# Patient Record
Sex: Female | Born: 1980 | Race: Black or African American | Hispanic: No | Marital: Married | Smoking: Former smoker
Health system: Southern US, Community
[De-identification: ages and names within clinical notes are randomized; demographics above are authoritative.]

## PROBLEM LIST (undated history)

## (undated) DIAGNOSIS — F329 Major depressive disorder, single episode, unspecified: Secondary | ICD-10-CM

## (undated) DIAGNOSIS — F32A Depression, unspecified: Secondary | ICD-10-CM

## (undated) DIAGNOSIS — F419 Anxiety disorder, unspecified: Secondary | ICD-10-CM

## (undated) DIAGNOSIS — F988 Other specified behavioral and emotional disorders with onset usually occurring in childhood and adolescence: Secondary | ICD-10-CM

## (undated) HISTORY — PX: WISDOM TOOTH EXTRACTION: SHX21

---

## 2014-06-19 ENCOUNTER — Ambulatory Visit: Payer: Self-pay | Admitting: Primary Care

## 2016-09-02 ENCOUNTER — Encounter: Payer: Self-pay | Admitting: Emergency Medicine

## 2016-09-02 ENCOUNTER — Emergency Department
Admission: EM | Admit: 2016-09-02 | Discharge: 2016-09-02 | Disposition: A | Discharge status: Home / Self Care | Payer: BLUE CROSS/BLUE SHIELD | Source: Home / Self Care | Attending: Family Medicine | Admitting: Family Medicine

## 2016-09-02 DIAGNOSIS — M7712 Lateral epicondylitis, left elbow: Secondary | ICD-10-CM | POA: Diagnosis not present

## 2016-09-02 MED ORDER — MELOXICAM 15 MG PO TABS: 15.0000 mg | ORAL_TABLET | Freq: Every day | ORAL | 1 refills | Status: DC

## 2016-09-02 NOTE — ED Triage Notes (Signed)
Left elbow pain x 4 days

## 2016-09-02 NOTE — Discharge Instructions (Signed)
Apply ice massage several times daily.  Continue until pain and swelling decrease.  Wear tennis elbow brace.  Begin range of motion and stretching exercises as tolerated.

## 2016-09-02 NOTE — ED Provider Notes (Signed)
[facility]    CSN: [id number] Arrival date & time: 09/02/16  [id number]     History   Chief Complaint Chief Complaint  Patient presents with  . Elbow Pain    HPI Juliet RudeMargo Bradsher is a 36 y.o. female.   Patient awoke with pain in her left lateral elbow 3 days ago.  She recalls no injury or change in activities, but admits that she tried some new weight lifting exercises about 1.5 weeks ago.  She has difficulty fully extending her elbow.  She has difficulty sleeping because of her pain.  She had no response to Baclofen.   The history is provided by the patient.  Arm Injury  Location:  Elbow Elbow location:  L elbow Injury: no   Pain details:    Quality:  Aching   Radiates to:  L shoulder   Severity:  Moderate   Onset quality:  Sudden   Duration:  3 days   Timing:  Constant   Progression:  Worsening Handedness:  Right-handed Prior injury to area:  No Relieved by:  Nothing Exacerbated by: extending elbow. Ineffective treatments:  Muscle relaxant Associated symptoms: decreased range of motion and muscle weakness   Associated symptoms: no fever, no numbness, no stiffness, no swelling and no tingling     History reviewed. No pertinent past medical history.  There are no active problems to display for this patient.   History reviewed. No pertinent surgical history.  OB History    No data available       Home Medications    Prior to Admission medications   Medication Sig Start Date End Date Taking? Authorizing Provider  baclofen (LIORESAL) 10 MG tablet Take 10 mg by mouth 3 (three) times daily.   Yes [provider]  buPROPion (WELLBUTRIN) 100 MG tablet Take 100 mg by mouth 2 (two) times daily.   Yes [provider]  etonogestrel (NEXPLANON) 68 MG IMPL implant 1 each by Subdermal route once.   Yes [provider]  ibuprofen (ADVIL,MOTRIN) 200 MG tablet Take 200 mg by mouth every 6 (six) hours as needed.   Yes [provider]  lisdexamfetamine (VYVANSE) 30 MG capsule Take 30 mg by mouth daily.   Yes [provider]  meloxicam (MOBIC) 15 MG tablet Take 1 tablet (15 mg total) by mouth daily. Take with food each evening. 09/02/16   Lattie HawBeese, Nyesha Cliff A, MD    Family History Family History  Problem Relation Age of Onset  . Hypertension Mother   . Diabetes Father     Social History Social History  Substance Use Topics  . Smoking status: Current Every Day Smoker  . Smokeless tobacco: Never Used  . Alcohol use Not on file     Allergies   Patient has no known allergies.   Review of Systems Review of Systems  Constitutional: Negative for fever.  Musculoskeletal: Negative for stiffness.  All other systems reviewed and are negative.    Physical Exam Triage Vital Signs ED Triage Vitals  Enc Vitals Group     BP 09/02/16 0946 122/78     Pulse Rate 09/02/16 0946 68     Resp --      Temp 09/02/16 0946 97.8 F (36.6 C)     Temp Source 09/02/16 0946 Oral     SpO2 09/02/16 0946 99 %     Weight 09/02/16 0946 147 lb (66.7 kg)     Height 09/02/16 0946 5\' 6"  (1.676 m)  Head Circumference --      Peak Flow --      Pain Score 09/02/16 0947 7     Pain Loc --      Pain Edu? --      Excl. in GC? --    No data found.   Updated Vital Signs BP 122/78 (BP Location: Left Arm)   Pulse 68   Temp 97.8 F (36.6 C) (Oral)   Ht 5\' 6"  (1.676 m)   Wt 147 lb (66.7 kg)   SpO2 99%   BMI 23.73 kg/m   Visual Acuity Right Eye Distance:   Left Eye Distance:   Bilateral Distance:    Right Eye Near:   Left Eye Near:    Bilateral Near:     Physical Exam  Constitutional: She appears well-developed and well-nourished. No distress.  HENT:  Head: Normocephalic.  Eyes: Pupils are equal, round, and reactive to light.  Neck: Normal range of motion.  Cardiovascular: Normal rate.   Pulmonary/Chest: Effort normal.  Musculoskeletal:       Left elbow: She exhibits normal range of motion, no swelling, no  effusion, no deformity and no laceration. Tenderness found. Lateral epicondyle tenderness noted.       Arms: There is distinct tenderness over the lateral epicondyle of the left elbow.  Palpation there during resisted dorsiflexion and supination of the wrist recreates her pain.   Neurological: She is alert.  Skin: Skin is warm and dry. No rash noted.  Nursing note and vitals reviewed.    UC Treatments / Results  Labs (all labs ordered are listed, but only abnormal results are displayed) Labs Reviewed - No data to display  EKG  EKG Interpretation None       Radiology No results found.  Procedures Procedures (including critical care time)  Medications Ordered in UC Medications - No data to display   Initial Impression / Assessment and Plan / UC Course  I have reviewed the triage vital signs and the nursing notes.  Pertinent labs & imaging results that were available during my care of the patient were reviewed by me and considered in my medical decision making (see chart for details).    Dispensed tennis elbow brace.  Rx for Mobic 15mg . Apply ice massage several times daily.  Continue until pain and swelling decrease.  Wear tennis elbow brace.  Begin range of motion and stretching exercises as tolerated. Followup with Dr. Rodney Langtonhomas Thekkekandam or Dr. Clementeen GrahamEvan Corey ([facility]) if not improving about two weeks.     Final Clinical Impressions(s) / UC Diagnoses   Final diagnoses:  Lateral epicondylitis, left elbow    New Prescriptions New Prescriptions   MELOXICAM (MOBIC) 15 MG TABLET    Take 1 tablet (15 mg total) by mouth daily. Take with food each evening.     Lattie HawBeese, Abygail Galeno A, MD 09/02/16 1022

## 2017-03-28 ENCOUNTER — Encounter: Payer: Self-pay | Admitting: Emergency Medicine

## 2017-03-28 ENCOUNTER — Emergency Department (INDEPENDENT_AMBULATORY_CARE_PROVIDER_SITE_OTHER)
Admission: EM | Admit: 2017-03-28 | Discharge: 2017-03-28 | Disposition: A | Discharge status: Home / Self Care | Payer: BLUE CROSS/BLUE SHIELD | Source: Home / Self Care | Attending: Family Medicine | Admitting: Family Medicine

## 2017-03-28 ENCOUNTER — Emergency Department (INDEPENDENT_AMBULATORY_CARE_PROVIDER_SITE_OTHER): Payer: BLUE CROSS/BLUE SHIELD

## 2017-03-28 ENCOUNTER — Other Ambulatory Visit: Payer: Self-pay

## 2017-03-28 DIAGNOSIS — M940 Chondrocostal junction syndrome [Tietze]: Secondary | ICD-10-CM | POA: Diagnosis not present

## 2017-03-28 DIAGNOSIS — R0781 Pleurodynia: Secondary | ICD-10-CM

## 2017-03-28 HISTORY — DX: Anxiety disorder, unspecified: F41.9

## 2017-03-28 HISTORY — DX: Depression, unspecified: F32.A

## 2017-03-28 HISTORY — DX: Other specified behavioral and emotional disorders with onset usually occurring in childhood and adolescence: F98.8

## 2017-03-28 HISTORY — DX: Major depressive disorder, single episode, unspecified: F32.9

## 2017-03-28 LAB — POCT URINALYSIS DIP (MANUAL ENTRY)
Bilirubin, UA: NEGATIVE
Glucose, UA: NEGATIVE mg/dL
Ketones, POC UA: NEGATIVE mg/dL
Leukocytes, UA: NEGATIVE
NITRITE UA: NEGATIVE
PROTEIN UA: NEGATIVE mg/dL
RBC UA: NEGATIVE
Spec Grav, UA: >=1.03 — AB (ref 1.010–1.025)
UROBILINOGEN UA: 0.2 U/dL
pH, UA: 5.5 (ref 5.0–8.0)

## 2017-03-28 MED ORDER — IBUPROFEN 600 MG PO TABS
600.0000 mg | ORAL_TABLET | Freq: Once | ORAL | Status: AC
  Administered 2017-03-28: 600 mg via ORAL

## 2017-03-28 MED ORDER — PREDNISONE 20 MG PO TABS: ORAL_TABLET | ORAL | 0 refills | Status: DC

## 2017-03-28 NOTE — Discharge Instructions (Signed)
Apply ice pack for 20 to 30 minutes, 3 to 4 times daily  Continue until pain and swelling decrease.  May take Tylenol as needed for pain. 

## 2017-03-28 NOTE — ED Provider Notes (Signed)
[facility]    CSN: [id number] Arrival date & time: 03/28/17  1202     History   Chief Complaint Chief Complaint  Patient presents with  . Dysuria    HPI Courtney RudeMargo Washington is a 37 y.o. female.   Patient reports that she developed dysuria about 6 days ago.  She visited an urgent care center 4 days ago where she was prescribed Cipro 500mg  BID for five days.  During the past two days she has had nausea without vomiting, chills, and pain with bowel movements.  She complains of persistent left flank and chest pain.  She notes that ibuprofen 400mg  improves her symptoms.  She uses Implanon for contraception. She states that she has not received a culture and sensitivity report from her recent urgent care visit.   The history is provided by the patient.  Flank Pain  This is a new problem. The current episode started more than 2 days ago. The problem occurs constantly. The problem has been gradually worsening. Pertinent negatives include no abdominal pain and no shortness of breath. The symptoms are aggravated by twisting. The symptoms are relieved by NSAIDs.    Past Medical History:  Diagnosis Date  . ADD (attention deficit disorder)   . Anxiety and depression     There are no active problems to display for this patient.   History reviewed. No pertinent surgical history.  OB History    No data available       Home Medications    Prior to Admission medications   Medication Sig Start Date End Date Taking? Authorizing Provider  baclofen (LIORESAL) 10 MG tablet Take 10 mg by mouth 3 (three) times daily.    [provider]  buPROPion (WELLBUTRIN) 100 MG tablet Take 100 mg by mouth 2 (two) times daily.    [provider]  etonogestrel (NEXPLANON) 68 MG IMPL implant 1 each by Subdermal route once.    [provider]  ibuprofen (ADVIL,MOTRIN) 200 MG tablet Take 200 mg by mouth every 6 (six) hours as needed.    [provider]    lisdexamfetamine (VYVANSE) 30 MG capsule Take 30 mg by mouth daily.    [provider]  meloxicam (MOBIC) 15 MG tablet Take 1 tablet (15 mg total) by mouth daily. Take with food each evening. 09/02/16   Lattie HawBeese, Mera Gunkel A, MD  predniSONE (DELTASONE) 20 MG tablet Take one tab by mouth twice daily for 4 days, then one daily for 3 days. Take with food. 03/28/17   Lattie HawBeese, Naida Escalante A, MD    Family History Family History  Problem Relation Age of Onset  . Hypertension Mother   . Diabetes Father     Social History Social History   Tobacco Use  . Smoking status: Current Every Day Smoker  . Smokeless tobacco: Never Used  Substance Use Topics  . Alcohol use: Yes  . Drug use: Not on file     Allergies   Patient has no known allergies.   Review of Systems Review of Systems  Constitutional: Positive for chills. Negative for diaphoresis, fatigue and fever.  HENT: Negative.   Eyes: Negative.   Respiratory: Negative for cough, chest tightness and shortness of breath.   Cardiovascular: Negative.   Gastrointestinal: Positive for nausea. Negative for abdominal pain and vomiting.  Genitourinary: Positive for flank pain.  Skin: Negative.   Neurological: Negative.      Physical Exam Triage Vital Signs ED Triage Vitals  Enc Vitals Group  BP 03/28/17 1423 97/62     Pulse Rate 03/28/17 1423 63     Resp 03/28/17 1423 16     Temp 03/28/17 1423 98 F (36.7 C)     Temp Source 03/28/17 1423 Oral     SpO2 03/28/17 1423 100 %     Weight 03/28/17 1424 145 lb (65.8 kg)     Height 03/28/17 1424 5\' 6"  (1.676 m)     Head Circumference --      Peak Flow --      Pain Score 03/28/17 1423 7     Pain Loc --      Pain Edu? --      Excl. in GC? --    No data found.  Updated Vital Signs BP 97/62 (BP Location: Right Arm)   Pulse 63   Temp 98 F (36.7 C) (Oral)   Resp 16   Ht 5\' 6"  (1.676 m)   Wt 145 lb (65.8 kg)   SpO2 100%   BMI 23.40 kg/m   Visual Acuity Right Eye Distance:    Left Eye Distance:   Bilateral Distance:    Right Eye Near:   Left Eye Near:    Bilateral Near:     Physical Exam  Constitutional: She appears well-developed and well-nourished. No distress.  HENT:  Head: Normocephalic.  Right Ear: External ear normal.  Left Ear: External ear normal.  Nose: Nose normal.  Mouth/Throat: Oropharynx is clear and moist.  Eyes: Conjunctivae are normal. Pupils are equal, round, and reactive to light.  Neck: Neck supple.  Cardiovascular: Normal heart sounds.  Pulmonary/Chest: Breath sounds normal.     She exhibits tenderness.  There is tenderness to palpation over left anterior/lateral/posterior inferior ribs as noted on diagram.   No flank tenderness present.    Abdominal: Soft. There is no tenderness.  Musculoskeletal: She exhibits no edema.  Lymphadenopathy:    She has no cervical adenopathy.  Skin: Skin is warm and dry. No rash noted.  Nursing note and vitals reviewed.    UC Treatments / Results  Labs (all labs ordered are listed, but only abnormal results are displayed) Labs Reviewed  POCT URINALYSIS DIP (MANUAL ENTRY) - Abnormal; Notable for the following components:      Result Value   Spec Grav, UA >=1.030 (*)    All other components within normal limits  URINE CULTURE    EKG  EKG Interpretation None       Radiology Dg Ribs Unilateral W/chest Left  Result Date: 03/28/2017 CLINICAL DATA:  Left lateral rib pain 2 weeks.  No injury. EXAM: LEFT RIBS AND CHEST - 3+ VIEW COMPARISON:  None. FINDINGS: Lungs are clear. Cardiomediastinal silhouette is within normal. No evidence of rib fracture. IMPRESSION: Negative. Electronically Signed   By: Elberta Fortis M.D.   On: 03/28/2017 15:19    Procedures Procedures (including critical care time)  Medications Ordered in UC Medications  ibuprofen (ADVIL,MOTRIN) tablet 600 mg (600 mg Oral Given 03/28/17 1428)     Initial Impression / Assessment and Plan / UC Course  I have reviewed  the triage vital signs and the nursing notes.  Pertinent labs & imaging results that were available during my care of the patient were reviewed by me and considered in my medical decision making (see chart for details).    Note normal chest X-ray. Begin prednisone burst/taper. Apply ice pack for 20 to 30 minutes, 3 to 4 times daily  Continue until pain and swelling decrease.  May take Tylenol as needed for pain. Followup with Dr. Rodney Langton or Dr. Clementeen Graham ([facility]) if not improving about th weeks.     Final Clinical Impressions(s) / UC Diagnoses   Final diagnoses:  Costochondritis    ED Discharge Orders        Ordered    predniSONE (DELTASONE) 20 MG tablet     03/28/17 1617         Lattie Haw, MD 04/02/17 [phone]

## 2017-03-28 NOTE — ED Triage Notes (Signed)
Patient went to walk-in urgent care 4 days ago for dysuria and was given Cipro for UTI; after 3 days of rx her symptoms have worsened and she has not received culture and sensitivity report. No OTCs today.

## 2017-03-29 LAB — URINE CULTURE
MICRO NUMBER:: 90211423
RESULT: NO GROWTH
SPECIMEN QUALITY:: ADEQUATE

## 2017-03-30 ENCOUNTER — Telehealth: Payer: Self-pay

## 2017-03-30 NOTE — Telephone Encounter (Signed)
Feeling better now.  UCX results given

## 2017-10-03 ENCOUNTER — Encounter: Payer: Self-pay | Admitting: Emergency Medicine

## 2017-10-03 ENCOUNTER — Emergency Department
Admission: EM | Admit: 2017-10-03 | Discharge: 2017-10-03 | Disposition: A | Discharge status: Home / Self Care | Payer: 59 | Attending: Emergency Medicine | Admitting: Emergency Medicine

## 2017-10-03 ENCOUNTER — Emergency Department: Payer: 59

## 2017-10-03 DIAGNOSIS — S62234A Other nondisplaced fracture of base of first metacarpal bone, right hand, initial encounter for closed fracture: Secondary | ICD-10-CM | POA: Diagnosis not present

## 2017-10-03 DIAGNOSIS — Y9389 Activity, other specified: Secondary | ICD-10-CM | POA: Insufficient documentation

## 2017-10-03 DIAGNOSIS — F17210 Nicotine dependence, cigarettes, uncomplicated: Secondary | ICD-10-CM | POA: Insufficient documentation

## 2017-10-03 DIAGNOSIS — Y92009 Unspecified place in unspecified non-institutional (private) residence as the place of occurrence of the external cause: Secondary | ICD-10-CM | POA: Diagnosis not present

## 2017-10-03 DIAGNOSIS — Y999 Unspecified external cause status: Secondary | ICD-10-CM | POA: Diagnosis not present

## 2017-10-03 DIAGNOSIS — Z79899 Other long term (current) drug therapy: Secondary | ICD-10-CM | POA: Diagnosis not present

## 2017-10-03 DIAGNOSIS — S6991XA Unspecified injury of right wrist, hand and finger(s), initial encounter: Secondary | ICD-10-CM | POA: Diagnosis present

## 2017-10-03 DIAGNOSIS — X500XXA Overexertion from strenuous movement or load, initial encounter: Secondary | ICD-10-CM | POA: Insufficient documentation

## 2017-10-03 NOTE — ED Provider Notes (Signed)
[name] Emergency Department Provider Note   ____________________________________________   First MD Initiated Contact with Patient 10/03/17 0124     (approximate)  I have reviewed the triage vital signs and the nursing notes.   HISTORY  Chief Complaint Hand Pain    HPI Courtney Washington is a 37 y.o. female who comes into the hospital today with some thumb pain after trying to do a handstand at home.  The patient states that she heard a loud crunch and was concerned that she broke her thumb.  She states that it hurts to move her right thumb but the pain is about a 5 out of 10 in intensity.  The patient did ice the area at home but did not take any medicine for pain.  She is here in the hospital today for further evaluation of her symptoms.  She has no numbness or tingling.  There is also no swelling to her thumb.   Past Medical History:  Diagnosis Date  . ADD (attention deficit disorder)   . Anxiety and depression     There are no active problems to display for this patient.   History reviewed. No pertinent surgical history.  Prior to Admission medications   Medication Sig Start Date End Date Taking? Authorizing Provider  baclofen (LIORESAL) 10 MG tablet Take 10 mg by mouth 3 (three) times daily.    [provider]  buPROPion (WELLBUTRIN) 100 MG tablet Take 100 mg by mouth 2 (two) times daily.    [provider]  etonogestrel (NEXPLANON) 68 MG IMPL implant 1 each by Subdermal route once.    [provider]  ibuprofen (ADVIL,MOTRIN) 200 MG tablet Take 200 mg by mouth every 6 (six) hours as needed.    [provider]  lisdexamfetamine (VYVANSE) 30 MG capsule Take 30 mg by mouth daily.    [provider]  meloxicam (MOBIC) 15 MG tablet Take 1 tablet (15 mg total) by mouth daily. Take with food each evening. 09/02/16   Lattie HawBeese, Stephen A, MD  predniSONE (DELTASONE) 20 MG tablet Take one tab by mouth twice daily  for 4 days, then one daily for 3 days. Take with food. 03/28/17   Lattie HawBeese, Stephen A, MD    Allergies Patient has no known allergies.  Family History  Problem Relation Age of Onset  . Hypertension Mother   . Diabetes Father     Social History Social History   Tobacco Use  . Smoking status: Current Every Day Smoker  . Smokeless tobacco: Never Used  Substance Use Topics  . Alcohol use: Yes  . Drug use: Not on file    Review of Systems  Constitutional: No fever/chills Eyes: No visual changes. ENT: No sore throat. Cardiovascular: Denies chest pain. Respiratory: Denies shortness of breath. Gastrointestinal: No abdominal pain.   Genitourinary: Negative for dysuria. Musculoskeletal: Right thumb pain Skin: Negative for rash. Neurological: Negative for headaches   ____________________________________________   PHYSICAL EXAM:  VITAL SIGNS: ED Triage Vitals  Enc Vitals Group     BP 10/03/17 0022 (!) 94/55     Pulse Rate 10/03/17 0022 75     Resp 10/03/17 0022 18     Temp 10/03/17 0022 98.5 F (36.9 C)     Temp Source 10/03/17 0022 Oral     SpO2 10/03/17 0022 99 %     Weight 10/03/17 0018 145 lb (65.8 kg)     Height 10/03/17 0018 5\' 6"  (1.676 m)  Head Circumference --      Peak Flow --      Pain Score 10/03/17 0018 6     Pain Loc --      Pain Edu? --      Excl. in GC? --     Constitutional: Alert and oriented. Well appearing and in mild distress. Eyes: Conjunctivae are normal. PERRL. EOMI. Head: Atraumatic. Nose: No congestion/rhinnorhea. Mouth/Throat: Mucous membranes are moist.  Oropharynx non-erythematous. Cardiovascular: Normal rate, regular rhythm.  Respiratory: Normal respiratory effort.  No retractions.  Gastrointestinal: Soft and nontender. No distention.  Positive bowel sounds Musculoskeletal: Tenderness to palpation at the base of the thumb with no significant swelling or bruising. Neurologic:  Normal speech and language.  Skin:  Skin is warm, dry  and intact.  Psychiatric: Mood and affect are normal.   ____________________________________________   LABS (all labs ordered are listed, but only abnormal results are displayed)  Labs Reviewed - No data to display ____________________________________________  EKG  none ____________________________________________  RADIOLOGY  ED MD interpretation: Right thumb x-ray: Possible small intra-articular fracture base of the first proximal phalanx  Official radiology report(s): Dg Finger Thumb Right  Result Date: 10/03/2017 CLINICAL DATA:  Injury EXAM: RIGHT THUMB 2+V COMPARISON:  None. FINDINGS: Possible small intra-articular fracture base of the first proximal phalanx. No subluxation. No radiopaque foreign body IMPRESSION: Possible small intra-articular fracture base of the first proximal phalanx Electronically Signed   By: Jasmine Pang M.D.   On: 10/03/2017 00:57    ____________________________________________   PROCEDURES  Procedure(s) performed: None  Procedures  Critical Care performed: No  ____________________________________________   INITIAL IMPRESSION / ASSESSMENT AND PLAN / ED COURSE  As part of my medical decision making, I reviewed the following data within the electronic MEDICAL RECORD NUMBER Notes from prior ED visits and [place] Controlled Substance Database   The patient did receive an x-ray for her injury and it appears that she may have a small fracture at the base of her first proximal phalanx.  The patient is not in any significant pain so I did order a prefabricated thumb spica for the patient.  The splint was placed on the patient and she will be discharged home to follow-up with orthopedic surgery.      ____________________________________________   FINAL CLINICAL IMPRESSION(S) / ED DIAGNOSES  Final diagnoses:  Other closed nondisplaced fracture of base of first metacarpal bone of right hand, initial encounter     ED Discharge Orders    None        Note:  This document was prepared using Dragon voice recognition software and may include unintentional dictation errors.    Rebecka Apley, MD 10/03/17 [phone]

## 2017-10-03 NOTE — ED Triage Notes (Signed)
Patient states that she was doing a hand stand and injured her right thumb.

## 2017-10-03 NOTE — Discharge Instructions (Addendum)
Please follow up with orthopedic surgery. °

## 2017-12-31 ENCOUNTER — Other Ambulatory Visit: Payer: Self-pay | Admitting: Sports Medicine

## 2017-12-31 DIAGNOSIS — S63641D Sprain of metacarpophalangeal joint of right thumb, subsequent encounter: Secondary | ICD-10-CM

## 2017-12-31 DIAGNOSIS — M79644 Pain in right finger(s): Secondary | ICD-10-CM

## 2017-12-31 DIAGNOSIS — S62514D Nondisplaced fracture of proximal phalanx of right thumb, subsequent encounter for fracture with routine healing: Secondary | ICD-10-CM

## 2018-01-20 ENCOUNTER — Ambulatory Visit
Admission: RE | Admit: 2018-01-20 | Discharge: 2018-01-20 | Disposition: A | Discharge status: Home / Self Care | Payer: 59 | Source: Ambulatory Visit | Attending: Sports Medicine | Admitting: Sports Medicine

## 2018-01-20 DIAGNOSIS — S5331XA Traumatic rupture of right ulnar collateral ligament, initial encounter: Secondary | ICD-10-CM | POA: Diagnosis not present

## 2018-01-20 DIAGNOSIS — S62514D Nondisplaced fracture of proximal phalanx of right thumb, subsequent encounter for fracture with routine healing: Secondary | ICD-10-CM | POA: Insufficient documentation

## 2018-01-20 DIAGNOSIS — X58XXXA Exposure to other specified factors, initial encounter: Secondary | ICD-10-CM | POA: Insufficient documentation

## 2018-01-20 DIAGNOSIS — M79644 Pain in right finger(s): Secondary | ICD-10-CM | POA: Insufficient documentation

## 2018-01-20 DIAGNOSIS — S63641D Sprain of metacarpophalangeal joint of right thumb, subsequent encounter: Secondary | ICD-10-CM

## 2018-01-20 DIAGNOSIS — S60221A Contusion of right hand, initial encounter: Secondary | ICD-10-CM | POA: Insufficient documentation

## 2018-02-23 ENCOUNTER — Encounter: Payer: Self-pay | Admitting: *Deleted

## 2018-02-23 ENCOUNTER — Other Ambulatory Visit: Payer: Self-pay

## 2018-02-23 NOTE — Anesthesia Preprocedure Evaluation (Addendum)
Anesthesia Evaluation  Patient identified by MRN, date of birth, ID band Patient awake    Reviewed: Allergy & Precautions, NPO status , Patient's Chart, lab work & pertinent test results  History of Anesthesia Complications Negative for: history of anesthetic complications  Airway Mallampati: I   Neck ROM: Full    Dental no notable dental hx.    Pulmonary former smoker (quit 2018),    Pulmonary exam normal breath sounds clear to auscultation       Cardiovascular Exercise Tolerance: Good negative cardio ROS Normal cardiovascular exam Rhythm:Regular Rate:Normal     Neuro/Psych PSYCHIATRIC DISORDERS (ADD) Anxiety Depression negative neurological ROS     GI/Hepatic negative GI ROS,   Endo/Other  negative endocrine ROS  Renal/GU negative Renal ROS     Musculoskeletal   Abdominal   Peds  Hematology negative hematology ROS (+)   Anesthesia Other Findings   Reproductive/Obstetrics                            Anesthesia Physical Anesthesia Plan  ASA: II  Anesthesia Plan: General   Post-op Pain Management:    Induction: Intravenous  PONV Risk Score and Plan: 3 and Dexamethasone and Ondansetron  Airway Management Planned: LMA  Additional Equipment:   Intra-op Plan:   Post-operative Plan: Extubation in OR  Informed Consent: I have reviewed the patients History and Physical, chart, labs and discussed the procedure including the risks, benefits and alternatives for the proposed anesthesia with the patient or authorized representative who has indicated his/her understanding and acceptance.       Plan Discussed with: CRNA  Anesthesia Plan Comments:        Anesthesia Quick Evaluation

## 2018-03-02 ENCOUNTER — Ambulatory Visit
Admission: RE | Admit: 2018-03-02 | Discharge: 2018-03-02 | Disposition: A | Discharge status: Home / Self Care | Payer: 59 | Attending: Surgery | Admitting: Surgery

## 2018-03-02 ENCOUNTER — Ambulatory Visit: Payer: 59 | Admitting: Anesthesiology

## 2018-03-02 ENCOUNTER — Encounter
Admission: RE | Disposition: A | Discharge status: Home / Self Care | Payer: Self-pay | Source: Home / Self Care | Attending: Surgery

## 2018-03-02 DIAGNOSIS — Y9343 Activity, gymnastics: Secondary | ICD-10-CM | POA: Diagnosis not present

## 2018-03-02 DIAGNOSIS — S63641A Sprain of metacarpophalangeal joint of right thumb, initial encounter: Secondary | ICD-10-CM | POA: Diagnosis present

## 2018-03-02 DIAGNOSIS — F988 Other specified behavioral and emotional disorders with onset usually occurring in childhood and adolescence: Secondary | ICD-10-CM | POA: Diagnosis not present

## 2018-03-02 DIAGNOSIS — F329 Major depressive disorder, single episode, unspecified: Secondary | ICD-10-CM | POA: Diagnosis not present

## 2018-03-02 DIAGNOSIS — Z79899 Other long term (current) drug therapy: Secondary | ICD-10-CM | POA: Diagnosis not present

## 2018-03-02 HISTORY — PX: CARPOMETACARPAL (CMC) FUSION OF THUMB: SHX6290

## 2018-03-02 SURGERY — CARPOMETACARPAL (CMC) FUSION OF THUMB
Anesthesia: General | Laterality: Right

## 2018-03-02 MED ORDER — METOCLOPRAMIDE HCL 5 MG PO TABS: 5.0000 mg | ORAL_TABLET | Freq: Three times a day (TID) | ORAL | Status: DC | PRN

## 2018-03-02 MED ORDER — CEFAZOLIN SODIUM-DEXTROSE 2-4 GM/100ML-% IV SOLN
2.0000 g | Freq: Once | INTRAVENOUS | Status: AC
  Administered 2018-03-02: 2 g via INTRAVENOUS

## 2018-03-02 MED ORDER — METOCLOPRAMIDE HCL 5 MG/ML IJ SOLN: 5.0000 mg | Freq: Three times a day (TID) | INTRAMUSCULAR | Status: DC | PRN

## 2018-03-02 MED ORDER — BUPIVACAINE HCL (PF) 0.5 % IJ SOLN
INTRAMUSCULAR | Status: DC | PRN
  Administered 2018-03-02: 5 mL

## 2018-03-02 MED ORDER — HYDROCODONE-ACETAMINOPHEN 5-325 MG PO TABS: 1.0000 | ORAL_TABLET | ORAL | Status: DC | PRN

## 2018-03-02 MED ORDER — OXYCODONE HCL 5 MG PO TABS
5.0000 mg | ORAL_TABLET | Freq: Once | ORAL | Status: AC | PRN
  Administered 2018-03-02: 5 mg via ORAL

## 2018-03-02 MED ORDER — FENTANYL CITRATE (PF) 100 MCG/2ML IJ SOLN
INTRAMUSCULAR | Status: DC | PRN
  Administered 2018-03-02 (×2): 25 ug via INTRAVENOUS
  Administered 2018-03-02: 50 ug via INTRAVENOUS

## 2018-03-02 MED ORDER — ONDANSETRON HCL 4 MG/2ML IJ SOLN
INTRAMUSCULAR | Status: DC | PRN
  Administered 2018-03-02: 4 mg via INTRAVENOUS

## 2018-03-02 MED ORDER — ACETAMINOPHEN 10 MG/ML IV SOLN
1000.0000 mg | Freq: Once | INTRAVENOUS | Status: DC | PRN
  Administered 2018-03-02: 1000 mg via INTRAVENOUS

## 2018-03-02 MED ORDER — PROPOFOL 10 MG/ML IV BOLUS
INTRAVENOUS | Status: DC | PRN
  Administered 2018-03-02: 200 mg via INTRAVENOUS

## 2018-03-02 MED ORDER — LACTATED RINGERS IV SOLN
INTRAVENOUS | Status: DC
  Administered 2018-03-02: 11:00:00 via INTRAVENOUS

## 2018-03-02 MED ORDER — LIDOCAINE HCL (CARDIAC) PF 100 MG/5ML IV SOSY
PREFILLED_SYRINGE | INTRAVENOUS | Status: DC | PRN
  Administered 2018-03-02: 50 mg via INTRATRACHEAL

## 2018-03-02 MED ORDER — KETAMINE HCL 50 MG/ML IJ SOLN
INTRAMUSCULAR | Status: DC | PRN
  Administered 2018-03-02: 25 mg via INTRAMUSCULAR

## 2018-03-02 MED ORDER — GLYCOPYRROLATE 0.2 MG/ML IJ SOLN
INTRAMUSCULAR | Status: DC | PRN
  Administered 2018-03-02: 0.2 mg via INTRAVENOUS

## 2018-03-02 MED ORDER — HYDROCODONE-ACETAMINOPHEN 5-325 MG PO TABS: 1.0000 | ORAL_TABLET | Freq: Four times a day (QID) | ORAL | 0 refills | Status: DC | PRN

## 2018-03-02 MED ORDER — OXYCODONE HCL 5 MG/5ML PO SOLN: 5.0000 mg | Freq: Once | ORAL | Status: AC | PRN

## 2018-03-02 MED ORDER — MIDAZOLAM HCL 5 MG/5ML IJ SOLN
INTRAMUSCULAR | Status: DC | PRN
  Administered 2018-03-02: 2 mg via INTRAVENOUS

## 2018-03-02 MED ORDER — ONDANSETRON HCL 4 MG/2ML IJ SOLN
4.0000 mg | Freq: Once | INTRAMUSCULAR | Status: AC | PRN
  Administered 2018-03-02: 4 mg via INTRAVENOUS

## 2018-03-02 MED ORDER — ONDANSETRON HCL 4 MG/2ML IJ SOLN: 4.0000 mg | Freq: Four times a day (QID) | INTRAMUSCULAR | Status: DC | PRN

## 2018-03-02 MED ORDER — POTASSIUM CHLORIDE IN NACL 20-0.9 MEQ/L-% IV SOLN: INTRAVENOUS | Status: DC

## 2018-03-02 MED ORDER — LACTATED RINGERS IV SOLN: INTRAVENOUS | Status: DC

## 2018-03-02 MED ORDER — ONDANSETRON HCL 4 MG PO TABS: 4.0000 mg | ORAL_TABLET | Freq: Four times a day (QID) | ORAL | Status: DC | PRN

## 2018-03-02 MED ORDER — DEXAMETHASONE SODIUM PHOSPHATE 4 MG/ML IJ SOLN
INTRAMUSCULAR | Status: DC | PRN
  Administered 2018-03-02: 6 mg via INTRAVENOUS

## 2018-03-02 MED ORDER — FENTANYL CITRATE (PF) 100 MCG/2ML IJ SOLN
25.0000 ug | INTRAMUSCULAR | Status: DC | PRN
  Administered 2018-03-02 (×2): 25 ug via INTRAVENOUS

## 2018-03-02 SURGICAL SUPPLY — 41 items
ANCHOR REPAIR HAND WRIST (Orthopedic Implant) ×3 IMPLANT
BANDAGE ELASTIC 3 LF NS (GAUZE/BANDAGES/DRESSINGS) ×3 IMPLANT
BLADE MINI RND TIP GREEN BEAV (BLADE) IMPLANT
BLADE OSC/SAGITTAL 5.5X25 (BLADE) ×3 IMPLANT
BNDG COHESIVE 4X5 TAN STRL (GAUZE/BANDAGES/DRESSINGS) ×3 IMPLANT
BNDG ESMARK 4X12 TAN STRL LF (GAUZE/BANDAGES/DRESSINGS) ×3 IMPLANT
BNDG STRETCH 4X75 STRL LF (GAUZE/BANDAGES/DRESSINGS) ×3 IMPLANT
CAST PADDING 3X4FT ST 30246 (SOFTGOODS) ×4
CHLORAPREP W/TINT 26ML (MISCELLANEOUS) ×3 IMPLANT
CLOSURE WOUND 1/2 X4 (GAUZE/BANDAGES/DRESSINGS) ×1
CORD BIP STRL DISP 12FT (MISCELLANEOUS) ×3 IMPLANT
COVER LIGHT HANDLE UNIVERSAL (MISCELLANEOUS) ×6 IMPLANT
CUFF TOURN SGL QUICK 18 (TOURNIQUET CUFF) ×3 IMPLANT
DRAPE FLUOR MINI C-ARM 54X84 (DRAPES) ×3 IMPLANT
GAUZE PETRO XEROFOAM 1X8 (MISCELLANEOUS) ×3 IMPLANT
GLOVE BIO SURGEON STRL SZ8 (GLOVE) ×6 IMPLANT
GLOVE INDICATOR 8.0 STRL GRN (GLOVE) ×3 IMPLANT
GOWN STRL REUS W/ TWL LRG LVL3 (GOWN DISPOSABLE) ×1 IMPLANT
GOWN STRL REUS W/ TWL XL LVL3 (GOWN DISPOSABLE) ×1 IMPLANT
GOWN STRL REUS W/TWL LRG LVL3 (GOWN DISPOSABLE) ×2
GOWN STRL REUS W/TWL XL LVL3 (GOWN DISPOSABLE) ×2
K-WIRE DBL END TROCAR 6X.062 (WIRE) ×6
KIT TURNOVER KIT A (KITS) ×3 IMPLANT
KWIRE DBL END TROCAR 6X.062 (WIRE) ×2 IMPLANT
NS IRRIG 500ML POUR BTL (IV SOLUTION) ×3 IMPLANT
PACK EXTREMITY ARMC (MISCELLANEOUS) ×3 IMPLANT
PAD CAST CTTN 3X4 STRL (SOFTGOODS) ×2 IMPLANT
PADDING CAST 2X4YD ST (MISCELLANEOUS) ×2
PADDING CAST 3IN STRL (MISCELLANEOUS) ×2
PADDING CAST BLEND 2X4 STRL (MISCELLANEOUS) ×1 IMPLANT
PADDING CAST BLEND 3X4 STRL (MISCELLANEOUS) ×1 IMPLANT
SPLINT WRIST M LT TX990308 (SOFTGOODS) IMPLANT
STOCKINETTE IMPERVIOUS 9X36 MD (GAUZE/BANDAGES/DRESSINGS) ×3 IMPLANT
STRIP CLOSURE SKIN 1/2X4 (GAUZE/BANDAGES/DRESSINGS) ×2 IMPLANT
SUT ETHIBOND 0 MO6 C/R (SUTURE) IMPLANT
SUT PROLENE 4 0 PS 2 18 (SUTURE) ×3 IMPLANT
SUT VIC AB 2-0 CT2 27 (SUTURE) ×3 IMPLANT
SUT VIC AB 2-0 SH 27 (SUTURE)
SUT VIC AB 2-0 SH 27XBRD (SUTURE) IMPLANT
SUT VIC AB 3-0 SH 27 (SUTURE)
SUT VIC AB 3-0 SH 27X BRD (SUTURE) IMPLANT

## 2018-03-02 NOTE — Discharge Instructions (Signed)
General Anesthesia, Adult, Care After °This sheet gives you information about how to care for yourself after your procedure. Your health care provider may also give you more specific instructions. If you have problems or questions, contact your health care provider. °What can I expect after the procedure? °After the procedure, the following side effects are common: °· Pain or discomfort at the IV site. °· Nausea. °· Vomiting. °· Sore throat. °· Trouble concentrating. °· Feeling cold or chills. °· Weak or tired. °· Sleepiness and fatigue. °· Soreness and body aches. These side effects can affect parts of the body that were not involved in surgery. °Follow these instructions at home: ° °For at least 24 hours after the procedure: °· Have a responsible adult stay with you. It is important to have someone help care for you until you are awake and alert. °· Rest as needed. °· Do not: °? Participate in activities in which you could fall or become injured. °? Drive. °? Use heavy machinery. °? Drink alcohol. °? Take sleeping pills or medicines that cause drowsiness. °? Make important decisions or sign legal documents. °? Take care of children on your own. °Eating and drinking °· Follow any instructions from your health care provider about eating or drinking restrictions. °· When you feel hungry, start by eating small amounts of foods that are soft and easy to digest (bland), such as toast. Gradually return to your regular diet. °· Drink enough fluid to keep your urine pale yellow. °· If you vomit, rehydrate by drinking water, juice, or clear broth. °General instructions °· If you have sleep apnea, surgery and certain medicines can increase your risk for breathing problems. Follow instructions from your health care provider about wearing your sleep device: °? Anytime you are sleeping, including during daytime naps. °? While taking prescription pain medicines, sleeping medicines, or medicines that make you drowsy. °· Return to  your normal activities as told by your health care provider. Ask your health care provider what activities are safe for you. °· Take over-the-counter and prescription medicines only as told by your health care provider. °· If you smoke, do not smoke without supervision. °· Keep all follow-up visits as told by your health care provider. This is important. °Contact a health care provider if: °· You have nausea or vomiting that does not get better with medicine. °· You cannot eat or drink without vomiting. °· You have pain that does not get better with medicine. °· You are unable to pass urine. °· You develop a skin rash. °· You have a fever. °· You have redness around your IV site that gets worse. °Get help right away if: °· You have difficulty breathing. °· You have chest pain. °· You have blood in your urine or stool, or you vomit blood. °Summary °· After the procedure, it is common to have a sore throat or nausea. It is also common to feel tired. °· Have a responsible adult stay with you for the first 24 hours after general anesthesia. It is important to have someone help care for you until you are awake and alert. °· When you feel hungry, start by eating small amounts of foods that are soft and easy to digest (bland), such as toast. Gradually return to your regular diet. °· Drink enough fluid to keep your urine pale yellow. °· Return to your normal activities as told by your health care provider. Ask your health care provider what activities are safe for you. °This information is not   intended to replace advice given to you by your health care provider. Make sure you discuss any questions you have with your health care provider. °Document Released: 05/04/2000 Document Revised: 09/11/2016 Document Reviewed: 09/11/2016 °Elsevier Interactive Patient Education © 2019 [employer] ° ° °Orthopedic discharge instructions: °Keep splint dry and intact. °Keep hand elevated above heart level. °Apply ice to affected area  frequently. °Take ibuprofen 600-800 mg TID with meals for 7-10 days, then as necessary. °Take pain medication as prescribed or ES Tylenol when needed.  °Return for follow-up in 10-14 days or as scheduled. °

## 2018-03-02 NOTE — Anesthesia Procedure Notes (Signed)
Procedure Name: LMA Insertion Performed by: Omer Jack, CRNA Pre-anesthesia Checklist: Timeout performed, Patient being monitored, Suction available, Emergency Drugs available and Patient identified Patient Re-evaluated:Patient Re-evaluated prior to induction Oxygen Delivery Method: Circle system utilized Preoxygenation: Pre-oxygenation with 100% oxygen Induction Type: IV induction Ventilation: Mask ventilation without difficulty LMA: LMA inserted LMA Size: 3.0 Number of attempts: 1 Dental Injury: Teeth and Oropharynx as per pre-operative assessment

## 2018-03-02 NOTE — H&P (Signed)
Paper H&P to be scanned into permanent record. H&P reviewed and patient re-examined. No changes. 

## 2018-03-02 NOTE — Anesthesia Postprocedure Evaluation (Addendum)
Anesthesia Post Note  Patient: Courtney Washington  Procedure(s) Performed: PRIMARY REPAIR OF THE ULNAR COLLATERAL LIGAMENT OF THE RIGHT THUMB (Right )  Patient location during evaluation: PACU Anesthesia Type: General Level of consciousness: awake and alert, oriented and patient cooperative Pain management: pain level controlled Vital Signs Assessment: post-procedure vital signs reviewed and stable Respiratory status: spontaneous breathing, nonlabored ventilation and respiratory function stable Cardiovascular status: blood pressure returned to baseline and stable Postop Assessment: adequate PO intake Anesthetic complications: no    Reed Breech

## 2018-03-02 NOTE — Transfer of Care (Signed)
Immediate Anesthesia Transfer of Care Note  Patient: Courtney Washington  Procedure(s) Performed: PRIMARY REPAIR OF THE ULNAR COLLATERAL LIGAMENT OF THE RIGHT THUMB (Right )  Patient Location: PACU  Anesthesia Type: General  Level of Consciousness: awake, alert  and patient cooperative  Airway and Oxygen Therapy: Patient Spontanous Breathing and Patient connected to supplemental oxygen  Post-op Assessment: Post-op Vital signs reviewed, Patient's Cardiovascular Status Stable, Respiratory Function Stable, Patent Airway and No signs of Nausea or vomiting  Post-op Vital Signs: Reviewed and stable  Complications: No apparent anesthesia complications

## 2018-03-02 NOTE — Op Note (Signed)
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  Post-Op Diagnosis:   Same  Procedure:   Repair of ulnar collateral ligament with internal brace supplement, right thumb.  Surgeon:   Maryagnes Amos, MD  Assistant:   Dwana Melena, PA-S  Anesthesia:   General LMA  Findings:   As above.  Complications:   None  Fluids:   900 cc crystalloid  EBL:   0 cc  UOP:   None  TT:   57 minutes at 250 mmHg  Drains:   None  Closure:   4-0 Prolene interrupted sutures  Implants:   Arthrex 2.4 mm mini BioComposite SutureTak x2  Brief Clinical Note:   The patient is a 38 year old female who sustained the above-noted injury nearly 5 months ago while trying to do a handstand at home. She resented to the emergency room where x-rays were unremarkable. She followed up with orthopedics where she was splinted for several weeks before being allowed to progress in her activities. She continues to have difficulty with discomfort along the ulnar aspect of her thumb, as well as a feeling as though her thumb wants to give way with grasping activities and MRI scan has confirmed the presence of chronic tearing with poor healing of the ulnar collateral ligament to the base of the proximal phalanx. The patient presents at this time for definitive management of her injury.  Procedure:   The patient was brought into the operating room and lain in the supine position.  After adequate general laryngeal mask anesthesia was obtained, the patient's right hand and upper extremity were prepped with ChloraPrep solution before being draped sterilely.  Preoperative antibiotics were administered.  A timeout was performed to verify the appropriate surgical site before the limb was exsanguinated with an Esmarch and the tourniquet inflated to 250 mmHg.  A curvilinear incision was made over the ulnar aspect of the thumb MCP joint.  The incision  was carried down through the subcutaneous tissues with care taken to identify and protect any dorsal sensory nerve branches.  The adductor aponeurosis was identified and released just ulnar to the extensor pollicis longus tendon from proximal to distal sufficiently to expose the base of the proximal phalanx.  The ulnar collateral ligament was identified and dissected free from the surrounding scar tissues.  The ulnar attachment site along the base of the proximal phalanx was lightly abraded of fibrinous tissue using a tiny curette and rongeurs.  A single 3.5 mm Arthrex SwiveLock anchor was inserted into the base of the proximal phalanx.  The 2-0 FiberWire sutures were passed through the tissue of the ulnar collateral ligament and tied securely to effect the repair.  The fiber tapes were then advanced proximally.  An attempt was made to secure the fiber tape to a second 3.5 mm Arthrex SwiveLock anchor inserted into the distal portion of the metacarpal just proximal to the UCL insertion site, but an adequate purchase could be obtained.  Therefore, the free ends of the fiber tape were passed through the ligament and tied securely to reinforce this repair.  The thumb appeared stable to radial stressing of the MCP joint following this repair.  The wound was copiously irrigated with sterile saline solution before the volar margin of the ulnar collateral ligament was reapproximated to the volar plate using 2-0 Vicryl interrupted sutures.  The abductor aponeurosis was reapproximated to itself using 2-0 Vicryl interrupted sutures.  The skin was closed using 4-0 Prolene interrupted sutures before a total of 5 cc of 0.5% plain Sensorcaine was injected in around the incision to help with postoperative analgesia.  A sterile bulky dressing was applied to the thumb before the thumb was placed into a thumb spica splint maintaining the thumb in a relaxed functional position.  The patient was then awakened, extubated, and returned  to the recovery room in satisfactory condition after tolerating the procedure well.

## 2018-03-03 ENCOUNTER — Encounter: Payer: Self-pay | Admitting: Surgery

## 2018-05-04 ENCOUNTER — Ambulatory Visit: Payer: 59 | Attending: Surgery | Admitting: Occupational Therapy

## 2018-05-04 ENCOUNTER — Other Ambulatory Visit: Payer: Self-pay

## 2018-05-04 ENCOUNTER — Encounter: Payer: Self-pay | Admitting: Occupational Therapy

## 2018-05-04 DIAGNOSIS — M79641 Pain in right hand: Secondary | ICD-10-CM

## 2018-05-04 DIAGNOSIS — M25641 Stiffness of right hand, not elsewhere classified: Secondary | ICD-10-CM | POA: Insufficient documentation

## 2018-05-04 DIAGNOSIS — M6281 Muscle weakness (generalized): Secondary | ICD-10-CM | POA: Diagnosis present

## 2018-05-04 NOTE — Therapy (Signed)
[facility] MEDICINE [address] [place], [address] Phone: [phone]   Fax:  [phone]  Occupational Therapy Evaluation  Patient Details  Name: Courtney Washington MRN: [id number] Date of Birth: [birth year] Referring Provider (OT): poggi   Encounter Date: 05/04/2018  OT End of Session - 05/04/18 1611    Visit Number  1    Number of Visits  8    Date for OT Re-Evaluation  06/01/18    OT Start Time  1000    OT Stop Time  1048    OT Time Calculation (min)  48 min    Activity Tolerance  Patient tolerated treatment well    Behavior During Therapy  [facility] for tasks assessed/performed       Past Medical History:  Diagnosis Date  . ADD (attention deficit disorder)   . Anxiety and depression     Past Surgical History:  Procedure Laterality Date  . CARPOMETACARPAL (CMC) FUSION OF THUMB Right 03/02/2018   Procedure: PRIMARY REPAIR OF THE ULNAR COLLATERAL LIGAMENT OF THE RIGHT THUMB;  Surgeon: Christena Flake, MD;  Location: [facility];  Service: Orthopedics;  Laterality: Right;  1.45 MM BIOMET JUGGERKNOT ANCHOR  ARTHREX SMALL INTERNAL BRACE SYSTEM FLUOROSCAN IMAGER  . WISDOM TOOTH EXTRACTION      There were no vitals filed for this visit.  Subjective Assessment - 05/04/18 1528    Subjective   I injuried it with showing something with hand stand to my son -and I heard pop in my thumb - it was hurting bad prior to surgery - still wearing the splint about 50% of time - but not sleeping -     Patient Stated Goals  I would like th get my functional use , range of motion and strength back in my thumb to use it like before - I am R handed     Currently in Pain?  Yes    Pain Score  2     Pain Location  Hand    Pain Orientation  Right    Pain Descriptors / Indicators  Aching    Pain Type  Surgical pain    Pain Onset  More than a month ago    Aggravating Factors   increase pain with use ,        [facility] OT Assessment  - 05/04/18 0001      Assessment   Medical Diagnosis  R thumb ulnar collateral lig repair    Referring Provider (OT)  poggi    Onset Date/Surgical Date  03/02/18    Hand Dominance  Right      Precautions   Required Braces or Orthoses  --   wear about 50% of time prefab thumb spica      Home  Environment   Lives With  Family      Prior Function   Vocation  Full time employment    Leisure  work from home in computer and phone, like sports with kids,  3 kids, yard work , on phone       AROM   Right Wrist Extension  78 Degrees    Right Wrist Flexion  88 Degrees    Right Wrist Radial Deviation  25 Degrees    Right Wrist Ulnar Deviation  30 Degrees      Right Hand AROM   R Thumb MCP 0-60  50 Degrees   60L   R Thumb IP 0-80  45 Degrees  70 L   R Thumb Radial ABduction/ADduction 0-55  50   62  L   R Thumb Palmar ABduction/ADduction 0-45  52   75 L    R Thumb Opposition to Index  --   Opposition to base of 5th         fluidotherapy done 10 min AROM for thumb in all planes  Prior to review of HEP :    Heat  AROM for thumb PA, RA,  Blocked IP flexion  Circular AROM - both ways  10 reps  2-3 x day  Wrist RD, UD AAROM edge of table 10 reps 2 x day   Cont with thumb spica 50 % of time             OT Education - 05/04/18 1611    Education Details  findings of eval, protocol and HEP     Person(s) Educated  Patient    Methods  Explanation;Demonstration;Verbal cues;Handout    Comprehension  Verbalized understanding;Returned demonstration       OT Short Term Goals - 05/04/18 1616      OT SHORT TERM GOAL #1   Title  Pt to be ind in HEP to increase AROM of R thumb and wrist UD with no increase of symptoms and correct me      OT SHORT TERM GOAL #2   Title  R thumb AROM improve to WNL to do buttons , zips and writing without increase symptoms     Baseline  See flowsheet- decrease AROM - and 50% difficulty with above act     Time  2    Period  Weeks     Status  New    Target Date  05/18/18      OT SHORT TERM GOAL #3   Title  Pain on PRWHE improve with more than 10 points     Baseline  pain at eval 15/50 on PRWHE     Time  4    Period  Weeks    Status  New    Target Date  06/01/18        OT Long Term Goals - 05/04/18 1628      OT LONG TERM GOAL #1   Title  R thumb strength increase to 5/5 to be able to cut with scissors, turn doorknob, do her hair     Baseline  no strength yet - 50% of time in splint     Time  4    Period  Weeks    Status  New    Target Date  06/01/18      OT LONG TERM GOAL #2   Title  Grip and prehension strength in R hand increase to more than 50% compare to L hand to return to prior level of function     Baseline  NT - 9wks -but still using splint 50% of time     Time  4    Period  Weeks    Status  New    Target Date  06/01/18      OT LONG TERM GOAL #3   Title  Function score on PRWHE improve with more than 15 points     Baseline  At eval function score on PRWHE still 21/50    Time  4    Period  Weeks    Status  New    Target Date  06/01/18            Plan -  05/04/18 1612    Clinical Impression Statement  Pt present at eval 9 wks s/p  R thumb ulnar collateral ligament repair - pt show decrease thumb AROM , and wrist UD with increase pain and stiffness -and decrease grip and prehension strength - limiiting her functional use of  R dominant hand     OT Occupational Profile and History  Problem Focused Assessment - Including review of records relating to presenting problem    Occupational performance deficits (Please refer to evaluation for details):  ADL's;IADL's;Work;Play;Leisure    Body Structure / Function / Physical Skills  ADL;Flexibility;ROM;Strength;Scar mobility;FMC;Sensation;Pain;Decreased knowledge of precautions    Rehab Potential  Good    Clinical Decision Making  Several treatment options, min-mod task modification necessary    Comorbidities Affecting Occupational Performance:  None     Modification or Assistance to Complete Evaluation   No modification of tasks or assist necessary to complete eval    OT Frequency  2x / week    OT Duration  4 weeks    OT Treatment/Interventions  Self-care/ADL training;Paraffin;Therapeutic exercise;Fluidtherapy;Manual Therapy;Scar mobilization;Splinting;Patient/family education    Plan  Assess progress with HEP and upgrade HEP as needed     OT Home Exercise Plan  see pt instruction     Consulted and Agree with Plan of Care  Patient       Patient will benefit from skilled therapeutic intervention in order to improve the following deficits and impairments:  Body Structure / Function / Physical Skills  Visit Diagnosis: Pain in right hand - Plan: Ot plan of care cert/re-cert  Stiffness of right hand, not elsewhere classified - Plan: Ot plan of care cert/re-cert  Muscle weakness (generalized) - Plan: Ot plan of care cert/re-cert    Problem List There are no active problems to display for this patient.   Oletta Cohn OTR/L,CLT 05/04/2018, 4:35 PM  [facility] SPORTS MEDICINE [address] [place], [address] Phone: [phone]   Fax:  [phone]  Name: Courtney Washington MRN: [id number] Date of Birth: 1980/03/16

## 2018-05-04 NOTE — Patient Instructions (Signed)
Heat  AROM for thumb PA, RA,  Blocked IP flexion  Circular AROM - both ways  10 reps  2-3 x day  Wrist RD, UD AAROM edge of table 10 reps 2 x day   Cont with thumb spica 50 % of time

## 2018-05-09 ENCOUNTER — Ambulatory Visit: Payer: 59 | Admitting: Occupational Therapy

## 2018-05-09 ENCOUNTER — Other Ambulatory Visit: Payer: Self-pay

## 2018-05-09 DIAGNOSIS — M79641 Pain in right hand: Secondary | ICD-10-CM

## 2018-05-09 DIAGNOSIS — M25641 Stiffness of right hand, not elsewhere classified: Secondary | ICD-10-CM

## 2018-05-09 DIAGNOSIS — M6281 Muscle weakness (generalized): Secondary | ICD-10-CM

## 2018-05-09 NOTE — Therapy (Signed)
[facility] [address], [place], [address] Phone: [phone]   Fax:  [phone]  Occupational Therapy Treatment  Patient Details  Name: Courtney Washington MRN: [id number] Date of Birth: 1980-08-23 Referring Provider (OT): poggi   Encounter Date: 05/09/2018  OT End of Session - 05/09/18 1610    Visit Number  2    Number of Visits  8    Date for OT Re-Evaluation  06/01/18    OT Start Time  1525    OT Stop Time  1555    OT Time Calculation (min)  30 min    Activity Tolerance  Patient tolerated treatment well    Behavior During Therapy  [facility] for tasks assessed/performed       Past Medical History:  Diagnosis Date  . ADD (attention deficit disorder)   . Anxiety and depression     Past Surgical History:  Procedure Laterality Date  . CARPOMETACARPAL (CMC) FUSION OF THUMB Right 03/02/2018   Procedure: PRIMARY REPAIR OF THE ULNAR COLLATERAL LIGAMENT OF THE RIGHT THUMB;  Surgeon: Christena Flake, MD;  Location: [facility];  Service: Orthopedics;  Laterality: Right;  1.45 MM BIOMET JUGGERKNOT ANCHOR  ARTHREX SMALL INTERNAL BRACE SYSTEM FLUOROSCAN IMAGER  . WISDOM TOOTH EXTRACTION      There were no vitals filed for this visit.  Subjective Assessment - 05/09/18 1605    Subjective   I did my exercises - keeping still my splint on around the kids - I have 3 boys and they all are home now with this virus thing - did not had pain     Patient Stated Goals  I would like th get my functional use , range of motion and strength back in my thumb to use it like before - I am R handed     Currently in Pain?  No/denies         [facility] OT Assessment - 05/09/18 0001      AROM   Right Wrist Extension  88 Degrees    Right Wrist Flexion  98 Degrees      Right Hand AROM   R Thumb MCP 0-60  60 Degrees    R Thumb IP 0-80  65 Degrees    R Thumb Radial ABduction/ADduction 0-55  55    R Thumb Palmar  ABduction/ADduction 0-45  55    R Thumb Opposition to Index  --   Opposition to below base of 5th       AROM measurement taken for thumb and wrist - great progress -and no pain with HEP review and progress         OT Treatments/Exercises (OP) - 05/09/18 0001      RUE Fluidotherapy   Number Minutes Fluidotherapy  7 Minutes    RUE Fluidotherapy Location  Hand;Wrist    Comments  prior to soft ROM - increase ROM , decrease stiffness      Thumb PA , RA  And opposition done -  Pt to cont with AROM after heat and add isometric afterwards     isometric for thumb PA, RA , flexion ,ext  10-12 reps - 2 sets pain free  At 45 Degrees - 2 x day  And increase to 3 sets tomorrow   Wrist flexion , ext, RD, UD end range - isometric end range  10-12 reps  2 x day  Increase 3 sets tomorrow      OT Education -  05/09/18 1609    Education Details  upgrade and add isometric to thumb and wrist in all planes     Person(s) Educated  Patient    Methods  Explanation;Demonstration;Verbal cues;Handout    Comprehension  Verbalized understanding;Returned demonstration       OT Short Term Goals - 05/04/18 1616      OT SHORT TERM GOAL #1   Title  Pt to be ind in HEP to increase AROM of R thumb and wrist UD with no increase of symptoms and correct me      OT SHORT TERM GOAL #2   Title  R thumb AROM improve to WNL to do buttons , zips and writing without increase symptoms     Baseline  See flowsheet- decrease AROM - and 50% difficulty with above act     Time  2    Period  Weeks    Status  New    Target Date  05/18/18      OT SHORT TERM GOAL #3   Title  Pain on PRWHE improve with more than 10 points     Baseline  pain at eval 15/50 on PRWHE     Time  4    Period  Weeks    Status  New    Target Date  06/01/18        OT Long Term Goals - 05/04/18 1628      OT LONG TERM GOAL #1   Title  R thumb strength increase to 5/5 to be able to cut with scissors, turn doorknob, do her hair      Baseline  no strength yet - 50% of time in splint     Time  4    Period  Weeks    Status  New    Target Date  06/01/18      OT LONG TERM GOAL #2   Title  Grip and prehension strength in R hand increase to more than 50% compare to L hand to return to prior level of function     Baseline  NT - 9wks -but still using splint 50% of time     Time  4    Period  Weeks    Status  New    Target Date  06/01/18      OT LONG TERM GOAL #3   Title  Function score on PRWHE improve with more than 15 points     Baseline  At eval function score on PRWHE still 21/50    Time  4    Period  Weeks    Status  New    Target Date  06/01/18            Plan - 05/09/18 1610    Clinical Impression Statement  PT is 9 1/2 wks s/p R thumb ulnar collateral ligament repair - pt show great progress in AROM for thumb and wrist since last time - able to add isometric this date to thumb and wrist - pain free    OT Occupational Profile and History  Problem Focused Assessment - Including review of records relating to presenting problem    Occupational performance deficits (Please refer to evaluation for details):  ADL's;IADL's;Work;Play;Leisure    Body Structure / Function / Physical Skills  ADL;Flexibility;ROM;Strength;Scar mobility;FMC;Sensation;Pain;Decreased knowledge of precautions    Rehab Potential  Good    Clinical Decision Making  Several treatment options, min-mod task modification necessary    Comorbidities Affecting Occupational Performance:  None  Modification or Assistance to Complete Evaluation   No modification of tasks or assist necessary to complete eval    OT Frequency  2x / week    OT Duration  4 weeks    OT Treatment/Interventions  Self-care/ADL training;Paraffin;Therapeutic exercise;Fluidtherapy;Manual Therapy;Scar mobilization;Splinting;Patient/family education    Plan  Assess progress with HEP and upgrade HEP as needed     OT Home Exercise Plan  see pt instruction     Consulted and Agree  with Plan of Care  Patient       Patient will benefit from skilled therapeutic intervention in order to improve the following deficits and impairments:  Body Structure / Function / Physical Skills  Visit Diagnosis: Pain in right hand  Stiffness of right hand, not elsewhere classified  Muscle weakness (generalized)    Problem List There are no active problems to display for this patient.   Oletta Cohn OTR/L,CLT 05/09/2018, 4:12 PM  [facility] PHYSICAL AND SPORTS MEDICINE [address] [place], [address] Phone: [phone]   Fax:  [phone]  Name: Courtney Washington MRN: [id number] Date of Birth: Oct 18, 1980

## 2018-05-09 NOTE — Patient Instructions (Signed)
Cont with same HEP  But add this date isometric for thumb PA, RA , flexion ,ext  10-12 reps - 2 sets pain free  At 45 Degrees - 2 x day  And increase to 3 sets tomorrow   Wrist flexion , ext, RD, UD end range - isometric end range  10-12 reps  2 x day  Increase 3 sets tomorrow

## 2018-05-11 ENCOUNTER — Other Ambulatory Visit: Payer: Self-pay

## 2018-05-11 ENCOUNTER — Ambulatory Visit: Payer: 59 | Attending: Surgery | Admitting: Occupational Therapy

## 2018-05-11 DIAGNOSIS — M25641 Stiffness of right hand, not elsewhere classified: Secondary | ICD-10-CM | POA: Diagnosis present

## 2018-05-11 DIAGNOSIS — M79641 Pain in right hand: Secondary | ICD-10-CM

## 2018-05-11 DIAGNOSIS — M6281 Muscle weakness (generalized): Secondary | ICD-10-CM | POA: Insufficient documentation

## 2018-05-11 NOTE — Therapy (Signed)
[facility]TS MEDICINE [address] [place], [address] Phone: [phone]   Fax:  [phone]  Occupational Therapy Treatment  Patient Details  Name: Courtney Washington MRN: [id number] Date of Birth: Nov 20, 1980 Referring Provider (OT): poggi   Encounter Date: 05/11/2018  OT End of Session - 05/11/18 0947    Visit Number  3    Number of Visits  8    Date for OT Re-Evaluation  06/01/18    OT Start Time  0907    OT Stop Time  0938    OT Time Calculation (min)  31 min    Activity Tolerance  Patient tolerated treatment well    Behavior During Therapy  [facility] for tasks assessed/performed       Past Medical History:  Diagnosis Date  . ADD (attention deficit disorder)   . Anxiety and depression     Past Surgical History:  Procedure Laterality Date  . CARPOMETACARPAL (CMC) FUSION OF THUMB Right 03/02/2018   Procedure: PRIMARY REPAIR OF THE ULNAR COLLATERAL LIGAMENT OF THE RIGHT THUMB;  Surgeon: Christena Flake, MD;  Location: [facility];  Service: Orthopedics;  Laterality: Right;  1.45 MM BIOMET JUGGERKNOT ANCHOR  ARTHREX SMALL INTERNAL BRACE SYSTEM FLUOROSCAN IMAGER  . WISDOM TOOTH EXTRACTION      There were no vitals filed for this visit.  Subjective Assessment - 05/11/18 0944    Subjective   I done the exercises - I can still feel if I pinch depending on what side of my thumb  - my thumb wants to give - weak - did not had any soreness really     Patient Stated Goals  I would like th get my functional use , range of motion and strength back in my thumb to use it like before - I am R handed     Currently in Pain?  No/denies         [facility] OT Assessment - 05/11/18 0001      Right Hand AROM   R Thumb Radial ABduction/ADduction 0-55  60    R Thumb Palmar ABduction/ADduction 0-45  60    R Thumb Opposition to Index  --   Opposition to [facility]      assess strength in thumb - and wrist  4+/5 for wrist  Thumb 4-/5  and pt report when she pinch on thumb - feel unstable one side more than other          OT Treatments/Exercises (OP) - 05/11/18 0001      RUE Fluidotherapy   Number Minutes Fluidotherapy  8 Minutes    RUE Fluidotherapy Location  Hand;Wrist    Comments  AROM for stiffness at [facility] for thumb and wrist in all planes       AROM for thumb PA and RA 10 reps  Opposition to all digits 10 reps  isometric for thumb in all planes - 10 reps   Review and done new  thumb strengthening with rubber band for PA and RA - 12 reps  2 sets  To do  2 x day Address with pt quality of motion more important than quantity   1 lbs weight for wrist in all planes - 12 reps 2 sets  - used 16oz hammer close to handle to hold  2 x day  And increase to 3 sets in 3 days if pain free  All was pain free  OT Education - 05/11/18 0946    Education Details  upgrade to rubber band HEP for thumb and 1 lbs weight for wrist in all planes     Person(s) Educated  Patient    Methods  Explanation;Demonstration;Verbal cues;Handout    Comprehension  Verbalized understanding;Returned demonstration       OT Short Term Goals - 05/04/18 1616      OT SHORT TERM GOAL #1   Title  Pt to be ind in HEP to increase AROM of R thumb and wrist UD with no increase of symptoms and correct me      OT SHORT TERM GOAL #2   Title  R thumb AROM improve to WNL to do buttons , zips and writing without increase symptoms     Baseline  See flowsheet- decrease AROM - and 50% difficulty with above act     Time  2    Period  Weeks    Status  New    Target Date  05/18/18      OT SHORT TERM GOAL #3   Title  Pain on PRWHE improve with more than 10 points     Baseline  pain at eval 15/50 on PRWHE     Time  4    Period  Weeks    Status  New    Target Date  06/01/18        OT Long Term Goals - 05/04/18 1628      OT LONG TERM GOAL #1   Title  R thumb strength increase to 5/5 to be able to cut with scissors, turn doorknob,  do her hair     Baseline  no strength yet - 50% of time in splint     Time  4    Period  Weeks    Status  New    Target Date  06/01/18      OT LONG TERM GOAL #2   Title  Grip and prehension strength in R hand increase to more than 50% compare to L hand to return to prior level of function     Baseline  NT - 9wks -but still using splint 50% of time     Time  4    Period  Weeks    Status  New    Target Date  06/01/18      OT LONG TERM GOAL #3   Title  Function score on PRWHE improve with more than 15 points     Baseline  At eval function score on PRWHE still 21/50    Time  4    Period  Weeks    Status  New    Target Date  06/01/18            Plan - 05/11/18 0948    Clinical Impression Statement  Pt is 10 wks s/p R thumb ulnar colllateral ligament repair - started this date strengthning- showed great progress in flexibilitiy     OT Occupational Profile and History  Problem Focused Assessment - Including review of records relating to presenting problem    Occupational performance deficits (Please refer to evaluation for details):  ADL's;IADL's;Work;Play;Leisure    Rehab Potential  Good    Clinical Decision Making  Several treatment options, min-mod task modification necessary    Comorbidities Affecting Occupational Performance:  None    Modification or Assistance to Complete Evaluation   No modification of tasks or assist necessary to complete eval    OT Frequency  2x / week    OT Duration  4 weeks    OT Treatment/Interventions  Self-care/ADL training;Paraffin;Therapeutic exercise;Fluidtherapy;Manual Therapy;Scar mobilization;Splinting;Patient/family education    Plan  Assess progress with HEP and upgrade HEP to 2 lbs for wrist and putty for grip /prehension    OT Home Exercise Plan  see pt instruction     Consulted and Agree with Plan of Care  Patient       Patient will benefit from skilled therapeutic intervention in order to improve the following deficits and  impairments:  Body Structure / Function / Physical Skills  Visit Diagnosis: Pain in right hand  Stiffness of right hand, not elsewhere classified  Muscle weakness (generalized)    Problem List There are no active problems to display for this patient.   Oletta Cohn OTR/L,CLT 05/11/2018, 9:50 AM  [facility] PHYSICAL AND SPORTS MEDICINE [address] [place], [address] Phone: [phone]   Fax:  [phone]  Name: Courtney Washington MRN: [id number] Date of Birth: [birth year]

## 2018-05-11 NOTE — Patient Instructions (Signed)
Change thumb strengthening to rubber band for PA and RA - 12 reps  2 sets and 2 x day 1 lbs weight for wrist in all planes - 12 reps 2 sets  2 x day  And increase to 3 sets in 3 days if pain free

## 2018-05-16 ENCOUNTER — Other Ambulatory Visit: Payer: Self-pay

## 2018-05-16 ENCOUNTER — Ambulatory Visit: Payer: 59 | Admitting: Occupational Therapy

## 2018-05-16 DIAGNOSIS — M79641 Pain in right hand: Secondary | ICD-10-CM

## 2018-05-16 DIAGNOSIS — M6281 Muscle weakness (generalized): Secondary | ICD-10-CM

## 2018-05-16 DIAGNOSIS — M25641 Stiffness of right hand, not elsewhere classified: Secondary | ICD-10-CM

## 2018-05-16 NOTE — Patient Instructions (Signed)
Same warm up exercises  Light blue putty for grip, lat and 3 point  2 sets of 12 reps  2  X day  Pain free 2 lbs weight for wrist in all planes  2 sets of 12  2 x day  Cont with rubber band - 2 sets of 12 - 2 bands

## 2018-05-16 NOTE — Therapy (Signed)
[facility] [address] [place], [address] Phone: [phone]   Fax:  [phone]  Occupational Therapy Treatment  Patient Details  Name: Courtney Washington MRN: [id number] Date of Birth: 08-May-1980 Referring Provider (OT): poggi   Encounter Date: 05/16/2018  OT End of Session - 05/16/18 0947    Visit Number  4    Number of Visits  8    Date for OT Re-Evaluation  06/01/18    OT Start Time  0904    OT Stop Time  0939    OT Time Calculation (min)  35 min    Activity Tolerance  Patient tolerated treatment well    Behavior During Therapy  [facility] for tasks assessed/performed       Past Medical History:  Diagnosis Date  . ADD (attention deficit disorder)   . Anxiety and depression     Past Surgical History:  Procedure Laterality Date  . CARPOMETACARPAL (CMC) FUSION OF THUMB Right 03/02/2018   Procedure: PRIMARY REPAIR OF THE ULNAR COLLATERAL LIGAMENT OF THE RIGHT THUMB;  Surgeon: Christena Flake, MD;  Location: [facility];  Service: Orthopedics;  Laterality: Right;  1.45 MM BIOMET JUGGERKNOT ANCHOR  ARTHREX SMALL INTERNAL BRACE SYSTEM FLUOROSCAN IMAGER  . WISDOM TOOTH EXTRACTION      There were no vitals filed for this visit.  Subjective Assessment - 05/16/18 0943    Subjective   DOing okay- wear my splint less than 50%- still little afraid and protective with thumb in act - I find myself doing the scar massage frequently     Patient Stated Goals  I would like th get my functional use , range of motion and strength back in my thumb to use it like before - I am R handed     Currently in Pain?  No/denies         [facility] OT Assessment - 05/16/18 0001      Strength   Right Hand Grip (lbs)  54    Right Hand Lateral Pinch  13 lbs    Right Hand 3 Point Pinch  9 lbs    Left Hand Grip (lbs)  70    Left Hand Lateral Pinch  16 lbs    Left Hand 3 Point Pinch  20 lbs      assess grip and prehension   see  flowsheet           OT Treatments/Exercises (OP) - 05/16/18 0001      RUE Fluidotherapy   Number Minutes Fluidotherapy  8 Minutes    RUE Fluidotherapy Location  Hand;Wrist    Comments  AROM for thumb in all planes      assess scar - no hyper sensitivity or adhesions  AROM WFL for thumb in all planes    Pt to do same warm up exercises  But upgrade to :  Light blue putty for grip, lat and 3 point  2 sets of 12 reps - assess pattern of loading of thumb - and mechanism  To do 2  X day  Pain free  2 lbs weight for wrist in all planes  2 sets of 12  2 x day  Cont with rubber band - 2 sets of 12 -  But increase to 2 bands - but proximal to thumb IP and MC's         OT Education - 05/16/18 0946    Education Details  increase to 2 bands,  add putty for thumb grip /prehension - and 2 lbs for wrist     Person(s) Educated  Patient    Methods  Explanation;Demonstration;Verbal cues;Handout    Comprehension  Verbalized understanding;Returned demonstration       OT Short Term Goals - 05/04/18 1616      OT SHORT TERM GOAL #1   Title  Pt to be ind in HEP to increase AROM of R thumb and wrist UD with no increase of symptoms and correct me      OT SHORT TERM GOAL #2   Title  R thumb AROM improve to WNL to do buttons , zips and writing without increase symptoms     Baseline  See flowsheet- decrease AROM - and 50% difficulty with above act     Time  2    Period  Weeks    Status  New    Target Date  05/18/18      OT SHORT TERM GOAL #3   Title  Pain on PRWHE improve with more than 10 points     Baseline  pain at eval 15/50 on PRWHE     Time  4    Period  Weeks    Status  New    Target Date  06/01/18        OT Long Term Goals - 05/04/18 1628      OT LONG TERM GOAL #1   Title  R thumb strength increase to 5/5 to be able to cut with scissors, turn doorknob, do her hair     Baseline  no strength yet - 50% of time in splint     Time  4    Period  Weeks    Status  New     Target Date  06/01/18      OT LONG TERM GOAL #2   Title  Grip and prehension strength in R hand increase to more than 50% compare to L hand to return to prior level of function     Baseline  NT - 9wks -but still using splint 50% of time     Time  4    Period  Weeks    Status  New    Target Date  06/01/18      OT LONG TERM GOAL #3   Title  Function score on PRWHE improve with more than 15 points     Baseline  At eval function score on PRWHE still 21/50    Time  4    Period  Weeks    Status  New    Target Date  06/01/18            Plan - 05/16/18 0947    Clinical Impression Statement  Pt is 11 wks s/p R thumb ulnar collateral ligament - add this date putty to HEP for prehension and grip - cont with strengthening for PA and RA -and up wrist to 2 lbs weight     OT Occupational Profile and History  Problem Focused Assessment - Including review of records relating to presenting problem    Occupational performance deficits (Please refer to evaluation for details):  ADL's;IADL's;Work;Play;Leisure    Body Structure / Function / Physical Skills  ADL;Flexibility;ROM;Strength;Scar mobility;FMC;Sensation;Pain;Decreased knowledge of precautions    Rehab Potential  Good    Clinical Decision Making  Several treatment options, min-mod task modification necessary    Comorbidities Affecting Occupational Performance:  None    Modification or Assistance to Complete Evaluation  No modification of tasks or assist necessary to complete eval    OT Frequency  2x / week    OT Duration  4 weeks    OT Treatment/Interventions  Self-care/ADL training;Paraffin;Therapeutic exercise;Fluidtherapy;Manual Therapy;Scar mobilization;Splinting;Patient/family education    Plan  Assess progress with HEP and upgrade HEP to 2 lbs for wrist and putty for grip /prehension    OT Home Exercise Plan  see pt instruction     Consulted and Agree with Plan of Care  Patient       Patient will benefit from skilled  therapeutic intervention in order to improve the following deficits and impairments:  Body Structure / Function / Physical Skills  Visit Diagnosis: Pain in right hand  Stiffness of right hand, not elsewhere classified  Muscle weakness (generalized)    Problem List There are no active problems to display for this patient.   Oletta Cohn OTR/L,CLT 05/16/2018, 9:49 AM  [facility] PHYSICAL AND SPORTS MEDICINE [address] [place], [address] Phone: [phone]   Fax:  [phone]  Name: Courtney Washington MRN: [id number] Date of Birth: Jan 28, 1981

## 2018-05-19 ENCOUNTER — Ambulatory Visit: Payer: 59 | Admitting: Occupational Therapy

## 2018-05-19 ENCOUNTER — Other Ambulatory Visit: Payer: Self-pay

## 2018-05-19 DIAGNOSIS — M25641 Stiffness of right hand, not elsewhere classified: Secondary | ICD-10-CM

## 2018-05-19 DIAGNOSIS — M6281 Muscle weakness (generalized): Secondary | ICD-10-CM

## 2018-05-19 DIAGNOSIS — M79641 Pain in right hand: Secondary | ICD-10-CM

## 2018-05-19 NOTE — Patient Instructions (Signed)
Increase to teal putty for grip , lat and 3 point  12-15 reps   2 x day  And 3 lbs for wrist RD, UD , Sup/Pro  12 reps  2 x day  can increase above act  in 48 hrs to 2 sets ,  and another 3 days 3 sets  Cont with wrist flexion ,extention 1-2 sets until last time 3 lbs

## 2018-05-19 NOTE — Therapy (Signed)
[place] [facility] PHYSICAL AND SPORTS MEDICINE [address] [place], [place], [address] Phone: [phone]   Fax:  [phone]  Occupational Therapy Treatment  Patient Details  Name: Courtney Washington MRN: [id number] Date of Birth: 1980/11/27 Referring Provider (OT): poggi   Encounter Date: 05/19/2018  OT End of Session - 05/19/18 1701    Visit Number  5    Number of Visits  8    Date for OT Re-Evaluation  06/01/18    OT Start Time  1507    OT Stop Time  1545    OT Time Calculation (min)  38 min    Activity Tolerance  Patient tolerated treatment well    Behavior During Therapy  [facility] for tasks assessed/performed       Past Medical History:  Diagnosis Date  . ADD (attention deficit disorder)   . Anxiety and depression     Past Surgical History:  Procedure Laterality Date  . CARPOMETACARPAL (CMC) FUSION OF THUMB Right 03/02/2018   Procedure: PRIMARY REPAIR OF THE ULNAR COLLATERAL LIGAMENT OF THE RIGHT THUMB;  Surgeon: Christena FlakePoggi, John J, MD;  [facility];  Service: Orthopedics;  Laterality: Right;  1.45 MM BIOMET JUGGERKNOT ANCHOR  [employer] SMALL INTERNAL BRACE SYSTEM FLUOROSCAN IMAGER  . WISDOM TOOTH EXTRACTION      There were no vitals filed for this visit.  Subjective Assessment - 05/19/18 1658    Subjective   I like the putty- I can tell my thumb is stronger and I am not  so afraid  - did maybe little more than you told me     Patient Stated Goals  I would like th get my functional use , range of motion and strength back in my thumb to use it like before - I am R handed     Currently in Pain?  No/denies         [facility] - 05/19/18 0001      Strength   Right Hand Grip (lbs)  62    Right Hand Lateral Pinch  15 lbs    Right Hand 3 Point Pinch  13 lbs    Left Hand Grip (lbs)  70    Left Hand Lateral Pinch  16 lbs    Left Hand 3 Point Pinch  20 lbs        Pt made progress with putty HEP - and report feeling much  stronger in thumb         OT Treatments/Exercises (OP) - 05/19/18 0001      RUE Paraffin   Number Minutes Paraffin  8 Minutes    RUE Paraffin Location  Hand    Comments  prior to ROM and soft tissue       soft tissue mobs to webspace and thumb scar - prior to ROm  Strength in PA and RA - still 4/5 for RA, PA -4/5 Pt doing 2 rubber bands - and can increase to 2 sets and 3 sets over weekend    Increase to teal putty for grip , lat and 3 point  12-15 reps - no issues   2 x day  And 3 lbs for wrist RD, UD , Sup/Pro  12 reps no issues   2 x day  can increase above activities   in 48 hrs to 2 sets ,  and another 3 days 3 sets  Cont with wrist flexion ,extention 1-2 sets until last time 3 lbs  OT Education - 05/19/18 1701    Education Details  upgrade HEP    Person(s) Educated  Patient    Methods  Explanation;Demonstration;Verbal cues;Handout    Comprehension  Verbalized understanding;Returned demonstration       OT Short Term Goals - 05/04/18 1616      OT SHORT TERM GOAL #1   Title  Pt to be ind in HEP to increase AROM of R thumb and wrist UD with no increase of symptoms and correct me      OT SHORT TERM GOAL #2   Title  R thumb AROM improve to WNL to do buttons , zips and writing without increase symptoms     Baseline  See flowsheet- decrease AROM - and 50% difficulty with above act     Time  2    Period  Weeks    Status  New    Target Date  05/18/18      OT SHORT TERM GOAL #3   Title  Pain on PRWHE improve with more than 10 points     Baseline  pain at eval 15/50 on PRWHE     Time  4    Period  Weeks    Status  New    Target Date  06/01/18        OT Long Term Goals - 05/04/18 1628      OT LONG TERM GOAL #1   Title  R thumb strength increase to 5/5 to be able to cut with scissors, turn doorknob, do her hair     Baseline  no strength yet - 50% of time in splint     Time  4    Period  Weeks    Status  New    Target Date  06/01/18      OT  LONG TERM GOAL #2   Title  Grip and prehension strength in R hand increase to more than 50% compare to L hand to return to prior level of function     Baseline  NT - 9wks -but still using splint 50% of time     Time  4    Period  Weeks    Status  New    Target Date  06/01/18      OT LONG TERM GOAL #3   Title  Function score on PRWHE improve with more than 15 points     Baseline  At eval function score on PRWHE still 21/50    Time  4    Period  Weeks    Status  New    Target Date  06/01/18            Plan - 05/19/18 1701    Clinical Impression Statement  Pt is 11 1/2 wks s/p R thumb collateral ligament repair - upgrade to teal putty for pt - tolerate well - pt to increase in over weekend to 2 sets and then 3 sets if pain free - and 3 lbs for wrist - upgrade pain free     OT Occupational Profile and History  Problem Focused Assessment - Including review of records relating to presenting problem    Occupational performance deficits (Please refer to evaluation for details):  ADL's;IADL's;Work;Play;Leisure    Body Structure / Function / Physical Skills  ADL;Flexibility;ROM;Strength;Scar mobility;FMC;Sensation;Pain;Decreased knowledge of precautions    Rehab Potential  Good    Clinical Decision Making  Several treatment options, min-mod task modification necessary    Comorbidities Affecting Occupational Performance:  None    Modification or Assistance to Complete Evaluation   No modification of tasks or assist necessary to complete eval    OT Frequency  2x / week    OT Duration  4 weeks    OT Treatment/Interventions  Self-care/ADL training;Paraffin;Therapeutic exercise;Fluidtherapy;Manual Therapy;Scar mobilization;Splinting;Patient/family education    Plan  Assess progress with HEP and with 3 lbs weight and teal putty for grip /prehension    OT Home Exercise Plan  see pt instruction     Consulted and Agree with Plan of Care  Patient       Patient will benefit from skilled  therapeutic intervention in order to improve the following deficits and impairments:  Body Structure / Function / Physical Skills  Visit Diagnosis: Pain in right hand  Stiffness of right hand, not elsewhere classified  Muscle weakness (generalized)    Problem List There are no active problems to display for this patient.   Oletta Cohn OTR/L,CLT 05/19/2018, 5:04 PM  [place] [facility] PHYSICAL AND SPORTS MEDICINE [address] [place], [address] Phone: [phone]   Fax:  [phone]  Name: Courtney Washington MRN: [id number] Date of Birth: 09-23-1980

## 2018-05-25 ENCOUNTER — Ambulatory Visit: Payer: 59 | Admitting: Occupational Therapy

## 2018-05-31 ENCOUNTER — Ambulatory Visit: Payer: 59 | Admitting: Occupational Therapy

## 2018-05-31 ENCOUNTER — Other Ambulatory Visit: Payer: Self-pay

## 2018-05-31 DIAGNOSIS — M79641 Pain in right hand: Secondary | ICD-10-CM

## 2018-05-31 DIAGNOSIS — M6281 Muscle weakness (generalized): Secondary | ICD-10-CM

## 2018-05-31 DIAGNOSIS — M25641 Stiffness of right hand, not elsewhere classified: Secondary | ICD-10-CM

## 2018-05-31 NOTE — Patient Instructions (Signed)
Upgrade to firm green putty   cont gripping , but add twisting (both ways) and pulling -wide grip like knob  12 reps  Lat and 3 point grip  12 reps each   upgrade to 2 sets in 3 days and then 3 sets in 6 days - but one time day then  Should be pain free

## 2018-05-31 NOTE — Therapy (Signed)
[place] [facility] PHYSICAL AND SPORTS MEDICINE [address] [place], [place], [address] Phone: [phone]   Fax:  [phone]  Occupational Therapy Treatment  Patient Details  Name: Courtney Washington MRN: [id number] Date of Birth: [birth year] Referring Provider (OT): poggi   Encounter Date: 05/31/2018  OT End of Session - 05/31/18 0953    Visit Number  6    Number of Visits  8    Date for OT Re-Evaluation  06/01/18    OT Start Time  0910    OT Stop Time  0945    OT Time Calculation (min)  35 min    Activity Tolerance  Patient tolerated treatment well    Behavior During Therapy  [facility] for tasks assessed/performed       Past Medical History:  Diagnosis Date  . ADD (attention deficit disorder)   . Anxiety and depression     Past Surgical History:  Procedure Laterality Date  . CARPOMETACARPAL (CMC) FUSION OF THUMB Right 03/02/2018   Procedure: PRIMARY REPAIR OF THE ULNAR COLLATERAL LIGAMENT OF THE RIGHT THUMB;  Surgeon: Christena FlakePoggi, John J, MD;  [facility];  Service: Orthopedics;  Laterality: Right;  1.45 MM BIOMET JUGGERKNOT ANCHOR  [employer] SMALL INTERNAL BRACE SYSTEM FLUOROSCAN IMAGER  . WISDOM TOOTH EXTRACTION      There were no vitals filed for this visit.  Subjective Assessment - 05/31/18 0914    Subjective   My thumb is much stronger - and trusting it more - still not strong when doing wide grip     Patient Stated Goals  I would like th get my functional use , range of motion and strength back in my thumb to use it like before - I am R handed     Currently in Pain?  No/denies         [facility] - 05/31/18 0001      Strength   Right Hand Grip (lbs)  62    Right Hand Lateral Pinch  15 lbs    Right Hand 3 Point Pinch  15 lbs    Left Hand Grip (lbs)  70    Left Hand Lateral Pinch  16 lbs    Left Hand 3 Point Pinch  20 lbs      assess grip and prehension strength - still decrease in grip and 3 point grip Wrist  strength 5/5 in all planes Thumb PA and RA 4+/5 - pt to cont with rubber band for PA and RA of thumb  3 x 10-12 reps pain free    after paraffin done 8 min to R hand - soft tissue massage to webspace , Carpal and MC spreads done   check scar - moving great         Upgrade to firm green putty   gripping , but add twisting (both ways) and pulling -wide grip like knob  12 reps - need mod V/c for tech of thumb  Lat and 3 point grip  12 reps each - short sausage 4-6 reps into it   upgrade to 2 sets in 3 days and then 3 sets in 6 days - but one time day then  Should be pain free          OT Education - 05/31/18 0953    Education Details  upgrade HEP    Person(s) Educated  Patient    Methods  Explanation;Demonstration;Verbal cues;Handout    Comprehension  Verbalized understanding;Returned demonstration  OT Short Term Goals - 05/31/18 0956      OT SHORT TERM GOAL #1   Title  Pt to be ind in HEP to increase AROM of R thumb and wrist UD with no increase of symptoms and correct me    Status  Achieved      OT SHORT TERM GOAL #2   Title  R thumb AROM improve to WNL to do buttons , zips and writing without increase symptoms     Status  Achieved      OT SHORT TERM GOAL #3   Title  Pain on PRWHE improve with more than 10 points     Baseline  no pain     Status  Achieved        OT Long Term Goals - 05/31/18 0956      OT LONG TERM GOAL #1   Title  R thumb strength increase to 5/5 to be able to cut with scissors, turn doorknob, do her hair     Baseline  still having issues with doorknob , wide grip pick up objects    Time  2    Period  Weeks    Status  On-going    Target Date  06/14/18      OT LONG TERM GOAL #2   Title  Grip and prehension strength in R hand increase to more than 50% compare to L hand to return to prior level of function     Baseline  Grip is more than 50% - but not back to prior function - wide grip still weak , turning doorknob ,    Time  2     Period  Weeks    Status  On-going    Target Date  06/14/18      OT LONG TERM GOAL #3   Title  Function score on PRWHE improve with more than 15 points     Baseline  At eval function score on PRWHE still 21/50 - will assess next visit     Time  2    Period  Weeks    Status  On-going    Target Date  06/14/18            Plan - 05/31/18 0954    Clinical Impression Statement  Pt is 12 1/2 wks s/p R thumb collateral ligament repair - pt progress very well - upgrade this date to firm green putty and add more functional grip and twisting using thumb in opposition positiong and wider grip to increase strength - pt to cont 2 wks with HEP and return back if needed for one more session     OT Occupational Profile and History  Problem Focused Assessment - Including review of records relating to presenting problem    Occupational performance deficits (Please refer to evaluation for details):  ADL's;IADL's;Work;Play;Leisure    Body Structure / Function / Physical Skills  ADL;Flexibility;ROM;Strength;Scar mobility;FMC;Sensation;Pain;Decreased knowledge of precautions    Rehab Potential  Good    Clinical Decision Making  Several treatment options, min-mod task modification necessary    Comorbidities Affecting Occupational Performance:  None    Modification or Assistance to Complete Evaluation   No modification of tasks or assist necessary to complete eval    OT Frequency  2x / week    OT Duration  4 weeks    OT Treatment/Interventions  Self-care/ADL training;Paraffin;Therapeutic exercise;Fluidtherapy;Manual Therapy;Scar mobilization;Splinting;Patient/family education    Plan  possible discharge - assess progress with green putty HEP  OT Home Exercise Plan  see pt instruction     Consulted and Agree with Plan of Care  Patient       Patient will benefit from skilled therapeutic intervention in order to improve the following deficits and impairments:  Body Structure / Function / Physical  Skills  Visit Diagnosis: Pain in right hand  Stiffness of right hand, not elsewhere classified  Muscle weakness (generalized)    Problem List There are no active problems to display for this patient.   Oletta Cohn OTR/l,CLT 05/31/2018, 9:58 AM  [place] [facility] PHYSICAL AND SPORTS MEDICINE [address] [place], [address] Phone: [phone]   Fax:  [phone]  Name: Courtney Washington MRN: [id number] Date of Birth: [birth year]

## 2018-06-14 ENCOUNTER — Ambulatory Visit: Payer: 59 | Attending: Surgery | Admitting: Occupational Therapy

## 2018-06-14 ENCOUNTER — Other Ambulatory Visit: Payer: Self-pay

## 2018-06-14 DIAGNOSIS — M25641 Stiffness of right hand, not elsewhere classified: Secondary | ICD-10-CM | POA: Insufficient documentation

## 2018-06-14 DIAGNOSIS — M79641 Pain in right hand: Secondary | ICD-10-CM | POA: Insufficient documentation

## 2018-06-14 DIAGNOSIS — M6281 Muscle weakness (generalized): Secondary | ICD-10-CM | POA: Insufficient documentation

## 2018-06-14 NOTE — Therapy (Signed)
[place] PHYSICAL AND SPORTS MEDICINE [address], [place], [address] Phone: [phone]   Fax:  [phone]  Occupational Therapy Treatment/discharge  Patient Details  Name: Courtney Washington MRN: [id number] Date of Birth: 05/31/1980 Referring Provider (OT): poggi   Encounter Date: 06/14/2018  OT End of Session - 06/14/18 0938    Visit Number  7    Number of Visits  7    Date for OT Re-Evaluation  06/14/18    OT Start Time  0903    OT Stop Time  0937    OT Time Calculation (min)  34 min    Activity Tolerance  Patient tolerated treatment well    Behavior During Therapy  [facility] for tasks assessed/performed       Past Medical History:  Diagnosis Date  . ADD (attention deficit disorder)   . Anxiety and depression     Past Surgical History:  Procedure Laterality Date  . CARPOMETACARPAL ([place]) FUSION OF THUMB Right 03/02/2018   Procedure: PRIMARY REPAIR OF THE ULNAR COLLATERAL LIGAMENT OF THE RIGHT THUMB;  Surgeon: Corky Mull, MD;  Location: [place];  Service: Orthopedics;  Laterality: Right;  1.45 MM BIOMET JUGGERKNOT ANCHOR  [place] SMALL INTERNAL BRACE SYSTEM FLUOROSCAN IMAGER  . WISDOM TOOTH EXTRACTION      There were no vitals filed for this visit.  Subjective Assessment - 06/14/18 0934    Subjective   Feeling much stronger - still little afraid for wide and heavy grip - I check the weight first always - numbness better and don't have pain     Patient Stated Goals  I would like th get my functional use , range of motion and strength back in my thumb to use it like before - I am R handed     Currently in Pain?  No/denies         [facility] OT Assessment - 06/14/18 0001      Strength   Right Hand Grip (lbs)  70    Right Hand Lateral Pinch  16 lbs    Right Hand 3 Point Pinch  17 lbs    Left Hand Grip (lbs)  70    Left Hand Lateral Pinch  15 lbs    Left Hand 3 Point Pinch  20 lbs       assess strength in thumb  PA and RA 5/5  Provided pt with new rubberbands  - was doing 2 - but hers was stretch out  With new ones had good resistance   PRWHE done with pt - pain 0/50 and function improve to 2/50 from 21/50     Upgrade putty with 1/2  firm dark blue to green -  And cont with gripping , pulling and twisting  And then 3 point and lateral grip  But decrease to 50% reps and sets    Can add the dark blue putty to existing putty in 2-3 wks to increase resistance  Increase act - as long as pain free- if not able - try again in few days                  OT Education - 06/14/18 0938    Education Details  upgrade HEP putty and discharge     Person(s) Educated  Patient    Methods  Explanation;Demonstration;Verbal cues;Handout    Comprehension  Verbalized understanding;Returned demonstration       OT Short Term Goals - 05/31/18 5364  OT SHORT TERM GOAL #1   Title  Pt to be ind in HEP to increase AROM of R thumb and wrist UD with no increase of symptoms and correct me    Status  Achieved      OT SHORT TERM GOAL #2   Title  R thumb AROM improve to WNL to do buttons , zips and writing without increase symptoms     Status  Achieved      OT SHORT TERM GOAL #3   Title  Pain on PRWHE improve with more than 10 points     Baseline  no pain     Status  Achieved        OT Long Term Goals - 06/14/18 0944      OT LONG TERM GOAL #1   Title  R thumb strength increase to 5/5 to be able to cut with scissors, turn doorknob, do her hair     Status  Achieved    Target Date  06/14/18      OT LONG TERM GOAL #2   Title  Grip and prehension strength in R hand increase to more than 50% compare to L hand to return to prior level of function     Baseline  grip strength R and L 70 lbs , and prehension see flowsheet     Status  Achieved    Target Date  06/14/18      OT LONG TERM GOAL #3   Title  Function score on PRWHE improve with more than 15 points     Baseline  At eval function score on  PRWHE still 21/50 - and now 2/50    Status  Achieved    Target Date  06/14/18            Plan - 06/14/18 0943    Clinical Impression Statement   Pt made excellent progress in R dominant hand thumb ROM , and strength- grip and prehension improved greatly -and pt's HEP with putty was upgraded again this date and to work on wide grip for stability for thumb - pt met all goals - no pain and PRWHE function improve from 21/50 to 2/50 - pt discharge from OT services with HEP for another 3 wks     OT Treatment/Interventions  Self-care/ADL training;Paraffin;Therapeutic exercise;Fluidtherapy;Manual Therapy;Scar mobilization;Splinting;Patient/family education    Plan  discharge with HEP     OT Home Exercise Plan  see pt instruction     Consulted and Agree with Plan of Care  Patient       Patient will benefit from skilled therapeutic intervention in order to improve the following deficits and impairments:     Visit Diagnosis: Pain in right hand - Plan: Ot plan of care cert/re-cert  Stiffness of right hand, not elsewhere classified - Plan: Ot plan of care cert/re-cert  Muscle weakness (generalized) - Plan: Ot plan of care cert/re-cert    Problem List There are no active problems to display for this patient.   Rosalyn Gess OTR/L,CLT 06/14/2018, 9:53 AM  [place] PHYSICAL AND SPORTS MEDICINE [address] [place], [address] Phone: [phone]   Fax:  [phone]  Name: Barbarajean Kinzler MRN: [id number] Date of Birth: 15-Feb-1980

## 2018-06-14 NOTE — Patient Instructions (Addendum)
Upgrade putty with 1/2  firm dark blue to green -  And cont with gripping , pulling and twisting  And then 3 point and lateral grip  But decrease to 50% reps and sets    Can add the dark blue putty to existing putty in 2-3 wks to increase resistance  Increase act - as long as pain free- if not able - try again in few days

## 2019-04-28 ENCOUNTER — Ambulatory Visit: Payer: Self-pay | Attending: Internal Medicine

## 2019-04-28 DIAGNOSIS — Z23 Encounter for immunization: Secondary | ICD-10-CM

## 2019-04-28 NOTE — Progress Notes (Signed)
   Covid-19 Vaccination Clinic  Name:  Courtney Washington    MRN: [id number] DOB: [birth year]  04/28/2019  [name] was observed post Covid-19 immunization for 15 minutes without incident. She was provided with Vaccine Information Sheet and instruction to access the V-Safe system.   [name] was instructed to call 911 with any severe reactions post vaccine: Marland Kitchen Difficulty breathing  . Swelling of face and throat  . A fast heartbeat  . A bad rash all over body  . Dizziness and weakness   Immunizations Administered    Name Date Dose VIS Date Route   Pfizer COVID-19 Vaccine 04/28/2019 11:39 AM 0.3 mL 01/20/2019 Intramuscular   Manufacturer: [employer], [employer]   Lot: [id number]   NDC: [address]-7

## 2019-05-23 ENCOUNTER — Ambulatory Visit: Payer: Self-pay | Attending: Internal Medicine

## 2019-05-23 DIAGNOSIS — Z23 Encounter for immunization: Secondary | ICD-10-CM

## 2019-05-23 NOTE — Progress Notes (Signed)
   Covid-19 Vaccination Clinic  Name:  Courtney Washington    MRN: [id number] DOB: [birth year]  05/23/2019  Courtney Washington was observed post Covid-19 immunization for 15 minutes without incident. She was provided with Vaccine Information Sheet and instruction to access the V-Safe system.   Courtney Washington was instructed to call 911 with any severe reactions post vaccine: Marland Kitchen Difficulty breathing  . Swelling of face and throat  . A fast heartbeat  . A bad rash all over body  . Dizziness and weakness   Immunizations Administered    Name Date Dose VIS Date Route   Pfizer COVID-19 Vaccine 05/23/2019  1:29 PM 0.3 mL 01/20/2019 Intramuscular   Manufacturer: [employer], [employer]   Lot: [id number]   NDC: [address]-2

## 2019-10-13 ENCOUNTER — Other Ambulatory Visit: Payer: Self-pay | Admitting: Chiropractic Medicine

## 2019-10-13 ENCOUNTER — Ambulatory Visit
Admission: RE | Admit: 2019-10-13 | Discharge: 2019-10-13 | Disposition: A | Discharge status: Home / Self Care | Payer: 59 | Source: Ambulatory Visit | Attending: Chiropractic Medicine | Admitting: Chiropractic Medicine

## 2019-10-13 DIAGNOSIS — M545 Low back pain, unspecified: Secondary | ICD-10-CM

## 2020-07-31 ENCOUNTER — Telehealth: Payer: 59 | Admitting: Physician Assistant

## 2020-07-31 DIAGNOSIS — M5442 Lumbago with sciatica, left side: Secondary | ICD-10-CM

## 2020-07-31 MED ORDER — CYCLOBENZAPRINE HCL 10 MG PO TABS: 10.0000 mg | ORAL_TABLET | Freq: Three times a day (TID) | ORAL | 0 refills | Status: DC | PRN

## 2020-07-31 MED ORDER — METHYLPREDNISOLONE 4 MG PO TBPK: ORAL_TABLET | ORAL | 0 refills | Status: DC

## 2020-07-31 NOTE — Progress Notes (Signed)
Virtual Visit Consent   Courtney Washington, you are scheduled for a virtual visit with a  provider today.     Just as with appointments in the office, your consent must be obtained to participate.  Your consent will be active for this visit and any virtual visit you may have with one of our providers in the next 365 days.     If you have a MyChart account, a copy of this consent can be sent to you electronically.  All virtual visits are billed to your insurance company just like a traditional visit in the office.    As this is a virtual visit, video technology does not allow for your provider to perform a traditional examination.  This may limit your provider's ability to fully assess your condition.  If your provider identifies any concerns that need to be evaluated in person or the need to arrange testing (such as labs, EKG, etc.), we will make arrangements to do so.     Although advances in technology are sophisticated, we cannot ensure that it will always work on either your end or our end.  If the connection with a video visit is poor, the visit may have to be switched to a telephone visit.  With either a video or telephone visit, we are not always able to ensure that we have a secure connection.     I need to obtain your verbal consent now.   Are you willing to proceed with your visit today?    Courtney Washington has provided verbal consent on 07/31/2020 for a virtual visit (video or telephone).   Piedad Climes, [place]   Date: 07/31/2020 6:02 PM   Virtual Visit via Video Note   I, Piedad Climes, connected with Courtney Washington ([id number], [birth year]) on 07/31/20 at  5:45 PM EDT by a video-enabled telemedicine application and verified that I am speaking with the correct person using two identifiers.  Location: Patient: Virtual Visit Location Patient: Home Provider: Virtual Visit Location Provider: Home Office   I discussed the limitations of evaluation and management by  telemedicine and the availability of in person appointments. The patient expressed understanding and agreed to proceed.    History of Present Illness: Courtney Washington is a 40 y.o. who identifies as a female who was assigned female at birth, and is being seen today for left-sided low back pain first noted 1.5 weeks ago. Notes she has been working out a lot and started noting tightness in the gluteal area. Thought she could do stretches and work it out but this has worsened. Now over the past 48 hours is noting more of a pinching pain that radiates from glute/hip down the leg into the toes. Occasional tingling in the toes of L foot. Denies RLE involvement. Denies saddle anesthesia or change to bowel/bladder habits. Does note a remote history of low back injury in with bulging disc - prior chiropractor care -- No issue up until recently. Has taken Ibuprofen and alternated with tylenol but only slight relief with this.   Problems: There are no problems to display for this patient.   Allergies: No Known Allergies Medications:  Current Outpatient Medications:    cyclobenzaprine (FLEXERIL) 10 MG tablet, Take 1 tablet (10 mg total) by mouth 3 (three) times daily as needed for muscle spasms., Disp: 15 tablet, Rfl: 0   methylPREDNISolone (MEDROL DOSEPAK) 4 MG TBPK tablet, Take following package directions, Disp: 21 tablet, Rfl: 0   buPROPion (WELLBUTRIN) 100 MG tablet,  Take 100 mg by mouth 2 (two) times daily. ( Pt takes 1 tab AM), Disp: , Rfl:    escitalopram (LEXAPRO) 20 MG tablet, Take 1 tablet by mouth every morning., Disp: , Rfl:    etonogestrel (NEXPLANON) 68 MG IMPL implant, Nexplanon 68 mg subdermal implant  Inject 1 implant by subcutaneous route., Disp: , Rfl:   Observations/Objective: Patient is well-developed, well-nourished in no acute distress.  Resting comfortably at home.  Head is normocephalic, atraumatic.  No labored breathing.  Speech is clear and coherent with logical content.  Patient  is alert and oriented at baseline.   Assessment and Plan: 1. Acute left-sided low back pain with left-sided sciatica - cyclobenzaprine (FLEXERIL) 10 MG tablet; Take 1 tablet (10 mg total) by mouth 3 (three) times daily as needed for muscle spasms.  Dispense: 15 tablet; Refill: 0 - methylPREDNISolone (MEDROL DOSEPAK) 4 MG TBPK tablet; Take following package directions  Dispense: 21 tablet; Refill: 0 Avoid heavy lifting and overexertion. Rx Medrol dose pack and Flexeril. Tylenol for breakthrough pain. Supportive measures and stretching reviewed. Will need in-person evaluation if symptoms are not resolving. Strict ER precautions reviewed with patient.   Follow Up Instructions: I discussed the assessment and treatment plan with the patient. The patient was provided an opportunity to ask questions and all were answered. The patient agreed with the plan and demonstrated an understanding of the instructions.  A copy of instructions were sent to the patient via MyChart.  The patient was advised to call back or seek an in-person evaluation if the symptoms worsen or if the condition fails to improve as anticipated.  Time:  I spent 15 minutes with the patient via telehealth technology discussing the above problems/concerns.    Piedad Climes, PA-C

## 2020-07-31 NOTE — Patient Instructions (Signed)
Courtney Washington, thank you for joining Piedad Climes, PA-C for today's virtual visit.  While this provider is not your primary care provider (PCP), if your PCP is located in our provider database this encounter information will be shared with them immediately following your visit.  Consent: (Patient) Courtney Washington provided verbal consent for this virtual visit at the beginning of the encounter.  Current Medications:  Current Outpatient Medications:    cyclobenzaprine (FLEXERIL) 10 MG tablet, Take 1 tablet (10 mg total) by mouth 3 (three) times daily as needed for muscle spasms., Disp: 15 tablet, Rfl: 0   methylPREDNISolone (MEDROL DOSEPAK) 4 MG TBPK tablet, Take following package directions, Disp: 21 tablet, Rfl: 0   buPROPion (WELLBUTRIN) 100 MG tablet, Take 100 mg by mouth 2 (two) times daily. ( Pt takes 1 tab AM), Disp: , Rfl:    escitalopram (LEXAPRO) 20 MG tablet, Take 1 tablet by mouth every morning., Disp: , Rfl:    etonogestrel (NEXPLANON) 68 MG IMPL implant, Nexplanon 68 mg subdermal implant  Inject 1 implant by subcutaneous route., Disp: , Rfl:    Medications ordered in this encounter:  Meds ordered this encounter  Medications   cyclobenzaprine (FLEXERIL) 10 MG tablet    Sig: Take 1 tablet (10 mg total) by mouth 3 (three) times daily as needed for muscle spasms.    Dispense:  15 tablet    Refill:  0    Order Specific Question:   Supervising Provider    Answer:   MILLER, BRIAN [[id number]]   methylPREDNISolone (MEDROL DOSEPAK) 4 MG TBPK tablet    Sig: Take following package directions    Dispense:  21 tablet    Refill:  0    Order Specific Question:   Supervising Provider    Answer:   Hyacinth Meeker, BRIAN [[id number]]    *If you need refills on other medications prior to your next appointment, please contact your pharmacy*  Follow-Up: Call back or seek an in-person evaluation if the symptoms worsen or if the condition fails to improve as anticipated.  Other Instructions Please avoid  heavy lifting and overexertion.  Tylenol OTC as needed. Take the steroid pack as directed until all tablets are gone. Take the Flexeril in the evening. You can take up to three times daily but do not drive after taking medication.  As symptoms are improving, start the stretches below.  If anything is not resolving, anything worsens or new symptoms develop, you need an in-person assessment. If you develop any change in your bowel/urinary habits or any numbness in the groin, you need ER evaluation ASAP as these are signs of serious spinal cord compression.   Sciatica Rehab Ask your health care provider which exercises are safe for you. Do exercises exactly as told by your health care provider and adjust them as directed. It is normal to feel mild stretching, pulling, tightness, or discomfort as you do these exercises. Stop right away if you feel sudden pain or your pain gets worse. Do not begin these exercises until told by your health care provider. Stretching and range-of-motion exercises These exercises warm up your muscles and joints and improve the movement and flexibility of your hips and back. These exercises also help to relieve pain,numbness, and tingling. Sciatic nerve glide Sit in a chair with your head facing down toward your chest. Place your hands behind your back. Let your shoulders slump forward. Slowly straighten one of your legs while you tilt your head back as if you are looking toward  the ceiling. Only straighten your leg as far as you can without making your symptoms worse. Hold this position for __________ seconds. Slowly return to the starting position. Repeat with your other leg. Repeat __________ times. Complete this exercise __________ times a day. Knee to chest with hip adduction and internal rotation  Lie on your back on a firm surface with both legs straight. Bend one of your knees and move it up toward your chest until you feel a gentle stretch in your lower back  and buttock. Then, move your knee toward the shoulder that is on the opposite side from your leg. This is hip adduction and internal rotation. Hold your leg in this position by holding on to the front of your knee. Hold this position for __________ seconds. Slowly return to the starting position. Repeat with your other leg. Repeat __________ times. Complete this exercise __________ times a day. Prone extension on elbows  Lie on your abdomen on a firm surface. A bed may be too soft for this exercise. Prop yourself up on your elbows. Use your arms to help lift your chest up until you feel a gentle stretch in your abdomen and your lower back. This will place some of your body weight on your elbows. If this is uncomfortable, try stacking pillows under your chest. Your hips should stay down, against the surface that you are lying on. Keep your hip and back muscles relaxed. Hold this position for __________ seconds. Slowly relax your upper body and return to the starting position. Repeat __________ times. Complete this exercise __________ times a day. Strengthening exercises These exercises build strength and endurance in your back. Endurance is theability to use your muscles for a long time, even after they get tired. Pelvic tilt This exercise strengthens the muscles that lie deep in the abdomen. Lie on your back on a firm surface. Bend your knees and keep your feet flat on the floor. Tense your abdominal muscles. Tip your pelvis up toward the ceiling and flatten your lower back into the floor. To help with this exercise, you may place a small towel under your lower back and try to push your back into the towel. Hold this position for __________ seconds. Let your muscles relax completely before you repeat this exercise. Repeat __________ times. Complete this exercise __________ times a day. Alternating arm and leg raises  Get on your hands and knees on a firm surface. If you are on a hard  floor, you may want to use padding, such as an exercise mat, to cushion your knees. Line up your arms and legs. Your hands should be directly below your shoulders, and your knees should be directly below your hips. Lift your left leg behind you. At the same time, raise your right arm and straighten it in front of you. Do not lift your leg higher than your hip. Do not lift your arm higher than your shoulder. Keep your abdominal and back muscles tight. Keep your hips facing the ground. Do not arch your back. Keep your balance carefully, and do not hold your breath. Hold this position for __________ seconds. Slowly return to the starting position. Repeat with your right leg and your left arm. Repeat __________ times. Complete this exercise __________ times a day. Posture and body mechanics Good posture and healthy body mechanics can help to relieve stress in your body's tissues and joints. Body mechanics refers to the movements and positions of your body while you do your daily activities. Posture  is part of body mechanics. Good posture means: Your spine is in its natural S-curve position (neutral). Your shoulders are pulled back slightly. Your head is not tipped forward. Follow these guidelines to improve your posture and body mechanics in youreveryday activities. Standing  When standing, keep your spine neutral and your feet about hip width apart. Keep a slight bend in your knees. Your ears, shoulders, and hips should line up. When you do a task in which you stand in one place for a long time, place one foot up on a stable object that is 2-4 inches (5-10 cm) high, such as a footstool. This helps keep your spine neutral.  Sitting  When sitting, keep your spine neutral and keep your feet flat on the floor. Use a footrest, if necessary, and keep your thighs parallel to the floor. Avoid rounding your shoulders, and avoid tilting your head forward. When working at a desk or a computer, keep your  desk at a height where your hands are slightly lower than your elbows. Slide your chair under your desk so you are close enough to maintain good posture. When working at a computer, place your monitor at a height where you are looking straight ahead and you do not have to tilt your head forward or downward to look at the screen.  Resting When lying down and resting, avoid positions that are most painful for you. If you have pain with activities such as sitting, bending, stooping, or squatting, lie in a position in which your body does not bend very much. For example, avoid curling up on your side with your arms and knees near your chest (fetal position). If you have pain with activities such as standing for a long time or reaching with your arms, lie with your spine in a neutral position and bend your knees slightly. Try the following positions: Lying on your side with a pillow between your knees. Lying on your back with a pillow under your knees. Lifting  When lifting objects, keep your feet at least shoulder width apart and tighten your abdominal muscles. Bend your knees and hips and keep your spine neutral. It is important to lift using the strength of your legs, not your back. Do not lock your knees straight out. Always ask for help to lift heavy or awkward objects.  This information is not intended to replace advice given to you by your health care provider. Make sure you discuss any questions you have with your healthcare provider. Document Revised: 05/20/2018 Document Reviewed: 02/17/2018 Elsevier Patient Education  2022 [employer].   If you have been instructed to have an in-person evaluation today at a local Urgent Care facility, please use the link below. It will take you to a list of all of our available [facility], including address, phone number and hours of operation. Please do not delay care.  [facility]  If you or a family member do not have a  primary care provider, use the link below to schedule a visit and establish care. When you choose a [facility] primary care physician or advanced practice provider, you gain a long-term partner in health. Find a Primary Care Provider  Learn more about [facility]'s in-office and virtual care options:  - Get Care Now

## 2020-08-10 ENCOUNTER — Ambulatory Visit: Payer: 59

## 2020-08-14 ENCOUNTER — Ambulatory Visit
Admission: EM | Admit: 2020-08-14 | Discharge: 2020-08-14 | Disposition: A | Discharge status: Home / Self Care | Payer: 59 | Attending: Emergency Medicine | Admitting: Emergency Medicine

## 2020-08-14 DIAGNOSIS — R208 Other disturbances of skin sensation: Secondary | ICD-10-CM

## 2020-08-14 DIAGNOSIS — M79605 Pain in left leg: Secondary | ICD-10-CM

## 2020-08-14 DIAGNOSIS — R200 Anesthesia of skin: Secondary | ICD-10-CM

## 2020-08-14 MED ORDER — CYCLOBENZAPRINE HCL 10 MG PO TABS: 10.0000 mg | ORAL_TABLET | Freq: Two times a day (BID) | ORAL | 0 refills | Status: AC | PRN

## 2020-08-14 NOTE — ED Provider Notes (Signed)
Renaldo Fiddler    CSN: [id number] Arrival date & time: 08/14/20  1428      History   Chief Complaint Chief Complaint  Patient presents with   Leg Pain    Left leg pain     HPI Courtney Washington is a 40 y.o. female.  Patient presents with left leg pain for several weeks and numbness in her left foot/toes x 1-2  weeks.  She states she originally had low back pain after she "pulled" her back while taking a Pilates class 2 months ago.  She has been seeing a chiropractor and the back pain resolved but the pain that was radiating down her leg has persisted.  No other injury.  Patient had a video visit on 07/31/2020; diagnosed with acute left low back pain with left sciatica; treated with Flexeril and Medrol Dosepak.  She states these medications improved her symptoms but she has run out of them.  She denies weakness in her leg or foot.  No difficulty walking.  She denies loss of bowel/bladder control, saddle anesthesia, redness, bruising, rash, wounds, abdominal pain, or other symptoms.  Her medical history also includes ADD, anxiety, depression.  The history is provided by the patient and medical records.   Past Medical History:  Diagnosis Date   ADD (attention deficit disorder)    Anxiety and depression     There are no problems to display for this patient.   Past Surgical History:  Procedure Laterality Date   CARPOMETACARPAL (CMC) FUSION OF THUMB Right 03/02/2018   Procedure: PRIMARY REPAIR OF THE ULNAR COLLATERAL LIGAMENT OF THE RIGHT THUMB;  Surgeon: Christena Flake, MD;  Location: [facility];  Service: Orthopedics;  Laterality: Right;  1.45 MM BIOMET JUGGERKNOT ANCHOR  ARTHREX SMALL INTERNAL BRACE SYSTEM FLUOROSCAN IMAGER   WISDOM TOOTH EXTRACTION      OB History   No obstetric history on file.      Home Medications    Prior to Admission medications   Medication Sig Start Date End Date Taking? Authorizing Provider  cyclobenzaprine (FLEXERIL) 10 MG tablet  Take 1 tablet (10 mg total) by mouth 2 (two) times daily as needed for muscle spasms. 08/14/20  Yes Mickie Bail, NP  buPROPion (WELLBUTRIN) 100 MG tablet Take 100 mg by mouth 2 (two) times daily. ( Pt takes 1 tab AM)    [provider]  escitalopram (LEXAPRO) 20 MG tablet Take 1 tablet by mouth every morning. 07/13/20   [provider]  etonogestrel (NEXPLANON) 68 MG IMPL implant Nexplanon 68 mg subdermal implant  Inject 1 implant by subcutaneous route.    [provider]    Family History Family History  Problem Relation Age of Onset   Hypertension Mother    Diabetes Father     Social History Social History   Tobacco Use   Smoking status: Former    Pack years: 0.00    Types: Cigarettes    Quit date: 02/10/2016    Years since quitting: 4.5   Smokeless tobacco: Never  Vaping Use   Vaping Use: Some days   Start date: 02/10/2016   Substances: Nicotine   Devices: Juul  Substance Use Topics   Alcohol use: Yes    Alcohol/week: 4.0 standard drinks    Types: 4 Standard drinks or equivalent per week     Allergies   Patient has no known allergies.   Review of Systems Review of Systems  Constitutional:  Negative for chills and fever.  Respiratory:  Negative for cough and shortness of breath.   Cardiovascular:  Negative for chest pain and palpitations.  Gastrointestinal:  Negative for abdominal pain and vomiting.  Genitourinary:  Negative for dysuria and hematuria.  Musculoskeletal:  Negative for back pain, gait problem and joint swelling.  Skin:  Negative for color change, rash and wound.  Neurological:  Positive for numbness. Negative for weakness.  All other systems reviewed and are negative.   Physical Exam Triage Vital Signs ED Triage Vitals  Enc Vitals Group     BP      Pulse      Resp      Temp      Temp src      SpO2      Weight      Height      Head Circumference      Peak Flow      Pain Score      Pain Loc      Pain Edu?       Excl. in GC?    No data found.  Updated Vital Signs BP 120/85 (BP Location: Left Arm)   Pulse 99   Temp 98.6 F (37 C) (Oral)   SpO2 97%   Visual Acuity Right Eye Distance:   Left Eye Distance:   Bilateral Distance:    Right Eye Near:   Left Eye Near:    Bilateral Near:     Physical Exam Vitals and nursing note reviewed.  Constitutional:      General: She is not in acute distress.    Appearance: She is well-developed. She is not ill-appearing.  HENT:     Head: Normocephalic and atraumatic.     Mouth/Throat:     Mouth: Mucous membranes are moist.  Eyes:     Conjunctiva/sclera: Conjunctivae normal.  Cardiovascular:     Rate and Rhythm: Normal rate and regular rhythm.     Heart sounds: Normal heart sounds.  Pulmonary:     Effort: Pulmonary effort is normal. No respiratory distress.     Breath sounds: Normal breath sounds.  Abdominal:     Palpations: Abdomen is soft.     Tenderness: There is no abdominal tenderness.  Musculoskeletal:        General: No swelling, tenderness, deformity or signs of injury. Normal range of motion.     Cervical back: Neck supple.  Skin:    General: Skin is warm and dry.     Capillary Refill: Capillary refill takes less than 2 seconds.     Findings: No bruising, erythema, lesion or rash.  Neurological:     General: No focal deficit present.     Mental Status: She is alert and oriented to person, place, and time.     Sensory: Sensory deficit present.     Motor: No weakness.     Gait: Gait normal.     Comments: Ambulatory without difficulty.  Patient reports decreased sensation in her left toes.  Psychiatric:        Mood and Affect: Mood normal.        Behavior: Behavior normal.     UC Treatments / Results  Labs (all labs ordered are listed, but only abnormal results are displayed) Labs Reviewed - No data to display  EKG   Radiology No results found.  Procedures Procedures (including critical care time)  Medications  Ordered in UC Medications - No data to display  Initial Impression / Assessment and Plan / UC Course  I have reviewed the triage vital signs and the nursing notes.  Pertinent labs & imaging results that were available during my care of the patient were reviewed by me and considered in my medical decision making (see chart for details).  Left leg pain, numbness of the left foot.  Instructed patient to take ibuprofen as needed for discomfort.  Also treating with Flexeril; precautions for drowsiness with this medication discussed.  Instructed patient to follow-up with a neurologist as soon as possible.  Contact information for Dr. Myer Haff provided as he is on-call for unassigned today.  Strict ED precautions discussed.  Patient agrees to plan of care.   Final Clinical Impressions(s) / UC Diagnoses   Final diagnoses:  Left leg pain  Numbness of left foot     Discharge Instructions      Take ibuprofen as needed for discomfort.  Take the muscle relaxer as needed for muscle spasm; Do not drive, operate machinery, or drink alcohol with this medication as it can cause drowsiness.   Follow up with a neurologist such as the one listed below.    Go to the emergency department if you have acute worsening symptoms.           ED Prescriptions     Medication Sig Dispense Auth. Provider   cyclobenzaprine (FLEXERIL) 10 MG tablet Take 1 tablet (10 mg total) by mouth 2 (two) times daily as needed for muscle spasms. 20 tablet Mickie Bail, NP      I have reviewed the PDMP during this encounter.   Mickie Bail, NP 08/14/20 [phone]

## 2020-08-14 NOTE — ED Triage Notes (Signed)
Patient presents to Urgent Care with complaints of left leg pain since 06/22. Pt states medications she was prescribed and stretching has provided some relief. Last dose  of ibuprofen 800 mg at 1100. Continues to have leg pain worse in the morning.   Denies fever.

## 2020-08-14 NOTE — Discharge Instructions (Addendum)
Take ibuprofen as needed for discomfort.  Take the muscle relaxer as needed for muscle spasm; Do not drive, operate machinery, or drink alcohol with this medication as it can cause drowsiness.   Follow up with a neurologist such as the one listed below.    Go to the emergency department if you have acute worsening symptoms.

## 2020-08-21 ENCOUNTER — Other Ambulatory Visit: Payer: Self-pay | Admitting: Physical Medicine & Rehabilitation

## 2020-08-21 DIAGNOSIS — M5442 Lumbago with sciatica, left side: Secondary | ICD-10-CM

## 2020-08-28 ENCOUNTER — Other Ambulatory Visit: Payer: Self-pay

## 2020-08-28 ENCOUNTER — Ambulatory Visit
Admission: RE | Admit: 2020-08-28 | Discharge: 2020-08-28 | Disposition: A | Discharge status: Home / Self Care | Payer: 59 | Source: Ambulatory Visit | Attending: Physical Medicine & Rehabilitation | Admitting: Physical Medicine & Rehabilitation

## 2020-08-28 DIAGNOSIS — M5442 Lumbago with sciatica, left side: Secondary | ICD-10-CM | POA: Diagnosis not present

## 2020-12-07 IMAGING — MR MR [PERSON_NAME]*[PERSON_NAME]* W/O CM
6 series · 40 of 40 positions shown · non-contrast
Comparison: Right thumb x-rays dated October 03, 2017.

CLINICAL DATA: Handstand injury. Pain at the base of the thumb.
Possible first proximal phalanx fracture.

EXAM:
MRI OF THE RIGHT HAND WITHOUT CONTRAST
TECHNIQUE: Multiplanar, multisequence MR imaging of the right thumb was
performed. No intravenous contrast was administered.

[Series 4: T1 · axial · right · 2.5mm · 0.37mm/px · z∈[-87,+31]mm · 12 of 45 slices shown (1 of 2)]
[im 1/45]
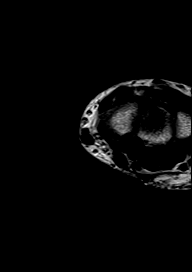
[im 5/45]
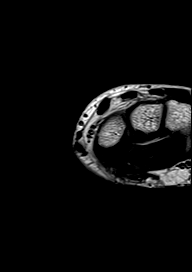
[im 9/45]
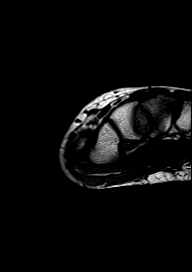
[im 13/45]
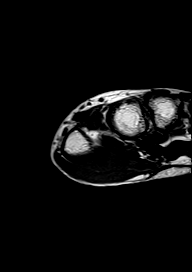
[im 17/45]
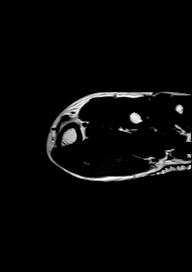
[im 21/45]
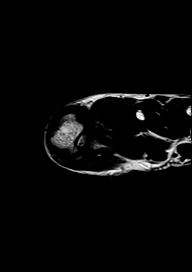
[im 25/45]
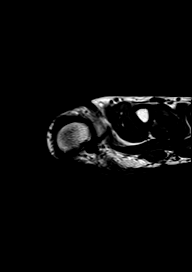
[im 29/45]
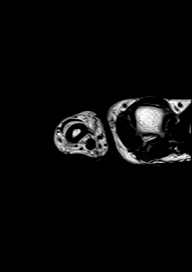
[im 33/45]
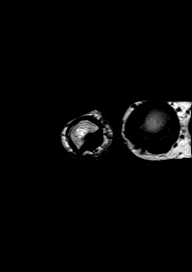
[im 37/45]
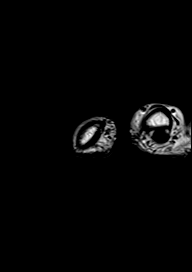
[im 41/45]
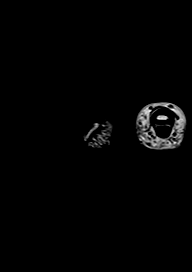
[im 45/45]
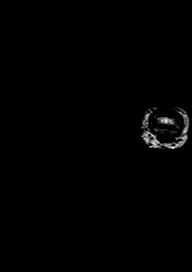

[Series 5: T2 fat-sat · axial · right · 2.5mm · 0.39mm/px · z∈[-89,+32]mm · 12 of 46 slices shown]
[im 1/46]
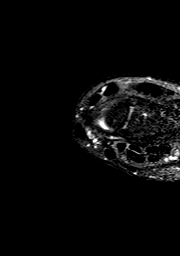
[im 5/46]
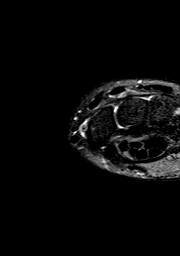
[im 9/46]
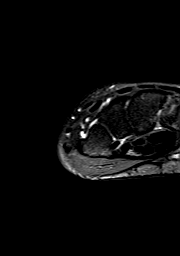
[im 13/46]
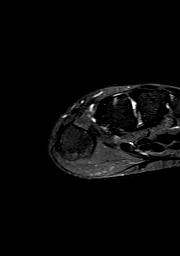
[im 17/46]
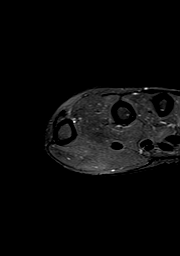
[im 21/46]
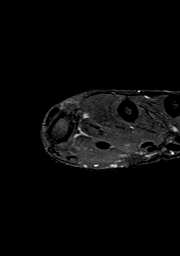
[im 25/46]
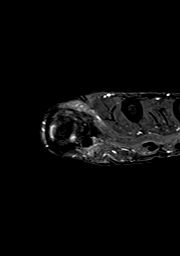
[im 29/46]
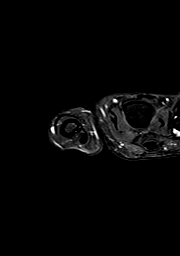
[im 33/46]
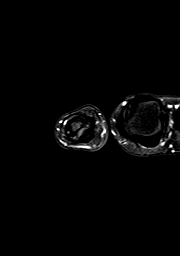
[im 37/46]
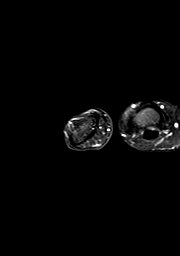
[im 41/46]
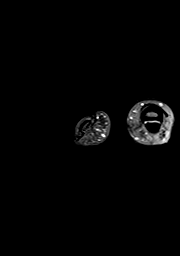
[im 46/46]
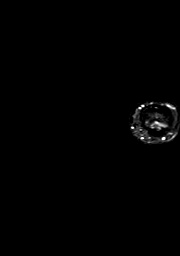

[Series 6: T1 · oblique · right · 2.0mm · 0.28mm/px · 4 of 17 slices shown (2 of 2)]
[im 1/17]
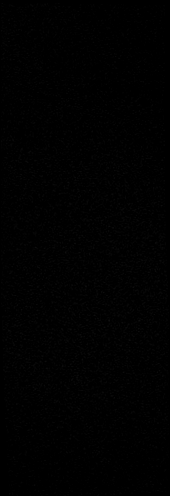
[im 6/17]
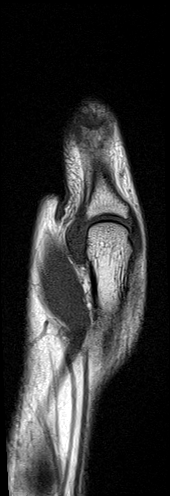
[im 11/17]
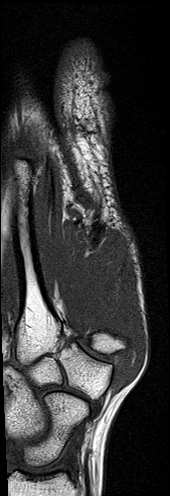
[im 17/17]
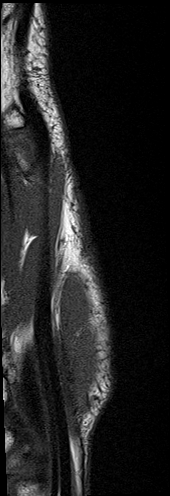

[Series 8: T2 · oblique · right · 2.0mm · 0.36mm/px · 4 of 17 slices shown]
[im 1/17]
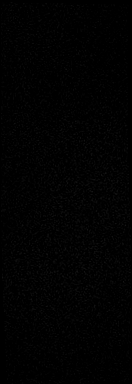
[im 6/17]
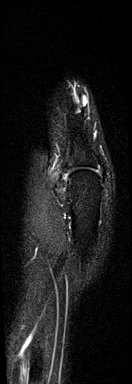
[im 11/17]
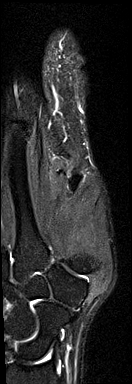
[im 17/17]
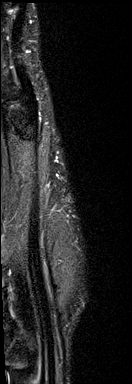

[Series 9: PD · oblique · right · 2.0mm · 0.38mm/px · 4 of 18 slices shown]
[im 1/18]
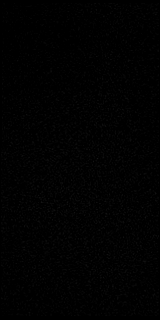
[im 6/18]
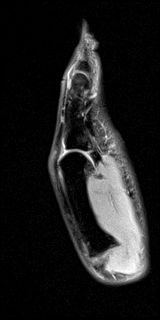
[im 12/18]
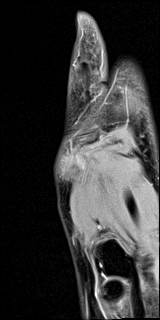
[im 18/18]
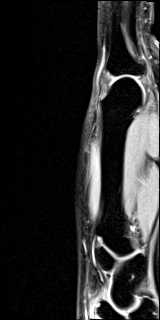

[Series 10: STIR · oblique · right · 2.0mm · 0.47mm/px · 4 of 17 slices shown]
[im 1/17]
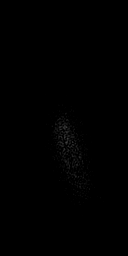
[im 6/17]
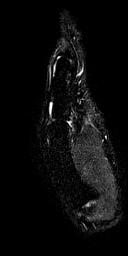
[im 11/17]
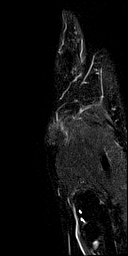
[im 17/17]
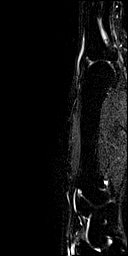

[40 of 40 positions shown; findings below may reference images not displayed]

FINDINGS: Bones/Joint/Cartilage

There is faint marrow edema in the volar base of the first proximal
phalanx, the volar aspect of the first metacarpal head, and the
ulnar sesamoid. No acute fracture or dislocation. Normal alignment.
No joint effusion.

Ligaments

The ulnar collateral ligament is markedly thickened and irregular in
appearance, with no normal ligament fibers seen attaching to the
volar base of the first proximal phalanx. Retracted ligament fibers
near the metacarpal heads project ulnarly and inferior superficial
to the adductor aponeurosis (series 8, images 5 and 6).

The radial collateral ligament is intact.

Muscles and Tendons
Flexor and extensor tendons are intact. No muscle edema or atrophy.

Soft tissue
No fluid collection or hematoma.  No soft tissue mass.
IMPRESSION: 1. Complete tear of the ulnar collateral ligament with Stener
lesion.
2. Small bone contusions around the first MCP joint. No fracture or
subluxation.

## 2021-09-08 ENCOUNTER — Other Ambulatory Visit: Payer: Self-pay | Admitting: Obstetrics and Gynecology

## 2021-09-08 DIAGNOSIS — R928 Other abnormal and inconclusive findings on diagnostic imaging of breast: Secondary | ICD-10-CM

## 2021-09-15 ENCOUNTER — Ambulatory Visit
Admission: RE | Admit: 2021-09-15 | Discharge: 2021-09-15 | Disposition: A | Discharge status: Home / Self Care | Payer: 59 | Source: Ambulatory Visit | Attending: Obstetrics and Gynecology | Admitting: Obstetrics and Gynecology

## 2021-09-15 DIAGNOSIS — R928 Other abnormal and inconclusive findings on diagnostic imaging of breast: Secondary | ICD-10-CM

## 2022-01-27 ENCOUNTER — Ambulatory Visit
Admission: EM | Admit: 2022-01-27 | Discharge: 2022-01-27 | Disposition: A | Discharge status: Home / Self Care | Payer: BC Managed Care – PPO | Attending: Urgent Care | Admitting: Urgent Care

## 2022-01-27 DIAGNOSIS — B3731 Acute candidiasis of vulva and vagina: Secondary | ICD-10-CM | POA: Diagnosis not present

## 2022-01-27 DIAGNOSIS — N3001 Acute cystitis with hematuria: Secondary | ICD-10-CM | POA: Diagnosis not present

## 2022-01-27 LAB — POCT URINALYSIS DIP (MANUAL ENTRY)
Bilirubin, UA: NEGATIVE
Glucose, UA: NEGATIVE mg/dL
Nitrite, UA: NEGATIVE
Spec Grav, UA: 1.025 (ref 1.010–1.025)
Urobilinogen, UA: 0.2 E.U./dL
pH, UA: 6 (ref 5.0–8.0)

## 2022-01-27 MED ORDER — NITROFURANTOIN MONOHYD MACRO 100 MG PO CAPS: 100.0000 mg | ORAL_CAPSULE | Freq: Two times a day (BID) | ORAL | 0 refills | Status: AC

## 2022-01-27 MED ORDER — FLUCONAZOLE 150 MG PO TABS: 150.0000 mg | ORAL_TABLET | Freq: Once | ORAL | 0 refills | Status: AC

## 2022-01-27 NOTE — ED Triage Notes (Signed)
Pt. Presents to UC w/ c/o urinary frequency and dysuria that started 3 days ago.

## 2022-01-27 NOTE — Discharge Instructions (Signed)
Follow up here or with your primary care provider if your symptoms are worsening or not improving with treatment.     

## 2022-01-27 NOTE — ED Provider Notes (Signed)
Renaldo Fiddler    CSN: [id number] Arrival date & time: 01/27/22  1314      History   Chief Complaint Chief Complaint  Patient presents with   Dysuria   Urinary Frequency    HPI Renley Gutman is a 41 y.o. female.    Dysuria Urinary Frequency    She presents to urgent care with complaint of urinary frequency and painful urination started about 3 days ago.  She endorses suprapubic pain.  Denies back pain.  Denies fever.  She requests prophylactic treatment for Candida vaginitis which typically follows antibiotic use.  Past Medical History:  Diagnosis Date   ADD (attention deficit disorder)    Anxiety and depression     There are no problems to display for this patient.   Past Surgical History:  Procedure Laterality Date   CARPOMETACARPAL (CMC) FUSION OF THUMB Right 03/02/2018   Procedure: PRIMARY REPAIR OF THE ULNAR COLLATERAL LIGAMENT OF THE RIGHT THUMB;  Surgeon: Christena Flake, MD;  Location: [facility];  Service: Orthopedics;  Laterality: Right;  1.45 MM BIOMET JUGGERKNOT ANCHOR  ARTHREX SMALL INTERNAL BRACE SYSTEM FLUOROSCAN IMAGER   WISDOM TOOTH EXTRACTION      OB History   No obstetric history on file.      Home Medications    Prior to Admission medications   Medication Sig Start Date End Date Taking? Authorizing Provider  buPROPion (WELLBUTRIN) 100 MG tablet Take 100 mg by mouth 2 (two) times daily. ( Pt takes 1 tab AM)    [provider]  cyclobenzaprine (FLEXERIL) 10 MG tablet Take 1 tablet (10 mg total) by mouth 2 (two) times daily as needed for muscle spasms. 08/14/20   Mickie Bail, NP  escitalopram (LEXAPRO) 20 MG tablet Take 1 tablet by mouth every morning. 07/13/20   [provider]  etonogestrel (NEXPLANON) 68 MG IMPL implant Nexplanon 68 mg subdermal implant  Inject 1 implant by subcutaneous route.    [provider]    Family History Family History  Problem Relation Age of Onset   Hypertension  Mother    Diabetes Father     Social History Social History   Tobacco Use   Smoking status: Former    Types: Cigarettes    Quit date: 02/10/2016    Years since quitting: 5.9   Smokeless tobacco: Never  Vaping Use   Vaping Use: Some days   Start date: 02/10/2016   Substances: Nicotine   Devices: Juul  Substance Use Topics   Alcohol use: Yes    Alcohol/week: 4.0 standard drinks of alcohol    Types: 4 Standard drinks or equivalent per week     Allergies   Patient has no known allergies.   Review of Systems Review of Systems  Genitourinary:  Positive for dysuria and frequency.     Physical Exam Triage Vital Signs ED Triage Vitals [01/27/22 1342]  Enc Vitals Group     BP 107/67     Pulse Rate 80     Resp 17     Temp 97.8 F (36.6 C)     Temp src      SpO2 97 %     Weight      Height      Head Circumference      Peak Flow      Pain Score 0     Pain Loc      Pain Edu?      Excl. in GC?  No data found.  Updated Vital Signs BP 107/67   Pulse 80   Temp 97.8 F (36.6 C)   Resp 17   LMP  (LMP Unknown)   SpO2 97%   Visual Acuity Right Eye Distance:   Left Eye Distance:   Bilateral Distance:    Right Eye Near:   Left Eye Near:    Bilateral Near:     Physical Exam Vitals reviewed.  Constitutional:      Appearance: Normal appearance.  Skin:    General: Skin is warm and dry.  Neurological:     General: No focal deficit present.     Mental Status: She is alert and oriented to person, place, and time.  Psychiatric:        Mood and Affect: Mood normal.        Behavior: Behavior normal.      UC Treatments / Results  Labs (all labs ordered are listed, but only abnormal results are displayed) Labs Reviewed  POCT URINALYSIS DIP (MANUAL ENTRY) - Abnormal; Notable for the following components:      Result Value   Clarity, UA cloudy (*)    Ketones, POC UA trace (5) (*)    Blood, UA trace-intact (*)    Protein Ur, POC trace (*)    Leukocytes, UA  Small (1+) (*)    All other components within normal limits    EKG   Radiology No results found.  Procedures Procedures (including critical care time)  Medications Ordered in UC Medications - No data to display  Initial Impression / Assessment and Plan / UC Course  I have reviewed the triage vital signs and the nursing notes.  Pertinent labs & imaging results that were available during my care of the patient were reviewed by me and considered in my medical decision making (see chart for details).   UA is positive with small leukocytes and trace blood.  Urine is cloudy.  Will treat for acute cystitis with hematuria with Macrobid x 5 days.  Also prescribing Diflucan at patient request.   Final Clinical Impressions(s) / UC Diagnoses   Final diagnoses:  None   Discharge Instructions   None    ED Prescriptions   None    PDMP not reviewed this encounter.   Charma Igo, [place] 01/27/22 1359
